# Patient Record
Sex: Male | Born: 1990
Health system: Southern US, Community
[De-identification: ages and names within clinical notes are randomized; demographics above are authoritative.]

## PROBLEM LIST (undated history)

## (undated) DIAGNOSIS — E66811 Obesity, class 1: Secondary | ICD-10-CM

## (undated) DIAGNOSIS — S82202A Unspecified fracture of shaft of left tibia, initial encounter for closed fracture: Secondary | ICD-10-CM

## (undated) DIAGNOSIS — F419 Anxiety disorder, unspecified: Secondary | ICD-10-CM

## (undated) DIAGNOSIS — R6882 Decreased libido: Secondary | ICD-10-CM

## (undated) DIAGNOSIS — E669 Obesity, unspecified: Secondary | ICD-10-CM

## (undated) DIAGNOSIS — K219 Gastro-esophageal reflux disease without esophagitis: Secondary | ICD-10-CM

## (undated) DIAGNOSIS — S43101A Unspecified dislocation of right acromioclavicular joint, initial encounter: Secondary | ICD-10-CM

## (undated) HISTORY — DX: Obesity, unspecified: E66.9

## (undated) HISTORY — DX: Obesity, class 1: E66.811

## (undated) HISTORY — DX: Decreased libido: R68.82

## (undated) HISTORY — DX: Gastro-esophageal reflux disease without esophagitis: K21.9

---
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
## 2014-06-16 DIAGNOSIS — M4316 Spondylolisthesis, lumbar region: Secondary | ICD-10-CM | POA: Insufficient documentation

## 2014-06-16 DIAGNOSIS — M545 Low back pain, unspecified: Secondary | ICD-10-CM | POA: Insufficient documentation

## 2015-06-17 ENCOUNTER — Emergency Department (HOSPITAL_COMMUNITY): Payer: BC Managed Care – PPO

## 2015-06-17 ENCOUNTER — Encounter (HOSPITAL_COMMUNITY): Payer: Self-pay | Admitting: Emergency Medicine

## 2015-06-17 ENCOUNTER — Inpatient Hospital Stay (HOSPITAL_COMMUNITY)
Admission: EM | Admit: 2015-06-17 | Discharge: 2015-06-20 | DRG: 494 | Disposition: A | Discharge status: Home Health | Payer: BC Managed Care – PPO | Attending: Orthopedic Surgery | Admitting: Orthopedic Surgery

## 2015-06-17 DIAGNOSIS — S82252A Displaced comminuted fracture of shaft of left tibia, initial encounter for closed fracture: Secondary | ICD-10-CM | POA: Diagnosis not present

## 2015-06-17 DIAGNOSIS — Z9889 Other specified postprocedural states: Secondary | ICD-10-CM

## 2015-06-17 DIAGNOSIS — Z8781 Personal history of (healed) traumatic fracture: Secondary | ICD-10-CM

## 2015-06-17 DIAGNOSIS — S82209A Unspecified fracture of shaft of unspecified tibia, initial encounter for closed fracture: Secondary | ICD-10-CM | POA: Diagnosis present

## 2015-06-17 DIAGNOSIS — S82202A Unspecified fracture of shaft of left tibia, initial encounter for closed fracture: Secondary | ICD-10-CM | POA: Diagnosis present

## 2015-06-17 DIAGNOSIS — Z419 Encounter for procedure for purposes other than remedying health state, unspecified: Secondary | ICD-10-CM

## 2015-06-17 HISTORY — DX: Unspecified fracture of shaft of left tibia, initial encounter for closed fracture: S82.202A

## 2015-06-17 LAB — BASIC METABOLIC PANEL
Anion gap: 7 (ref 5–15)
BUN: 19 mg/dL (ref 6–20)
CO2: 23 mmol/L (ref 22–32)
CREATININE: 0.9 mg/dL (ref 0.61–1.24)
Calcium: 9 mg/dL (ref 8.9–10.3)
Chloride: 108 mmol/L (ref 101–111)
GLUCOSE: 116 mg/dL — AB (ref 65–99)
Potassium: 3.9 mmol/L (ref 3.5–5.1)
SODIUM: 138 mmol/L (ref 135–145)

## 2015-06-17 LAB — CBC
HCT: 43.7 % (ref 39.0–52.0)
HEMOGLOBIN: 15.2 g/dL (ref 13.0–17.0)
MCH: 29.1 pg (ref 26.0–34.0)
MCHC: 34.8 g/dL (ref 30.0–36.0)
MCV: 83.7 fL (ref 78.0–100.0)
PLATELETS: 221 10*3/uL (ref 150–400)
RBC: 5.22 MIL/uL (ref 4.22–5.81)
RDW: 13.2 % (ref 11.5–15.5)
WBC: 13.8 10*3/uL — ABNORMAL HIGH (ref 4.0–10.5)

## 2015-06-17 MED ORDER — FENTANYL CITRATE (PF) 100 MCG/2ML IJ SOLN
100.0000 ug | Freq: Once | INTRAMUSCULAR | Status: AC
  Administered 2015-06-17: 100 ug via INTRAVENOUS
  Filled 2015-06-17: qty 2
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
## 2015-06-17 NOTE — ED Notes (Signed)
Ortho tech at bedside 
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
## 2015-06-18 ENCOUNTER — Inpatient Hospital Stay (HOSPITAL_COMMUNITY): Payer: BC Managed Care – PPO

## 2015-06-18 ENCOUNTER — Inpatient Hospital Stay (HOSPITAL_COMMUNITY): Payer: BC Managed Care – PPO | Admitting: Anesthesiology

## 2015-06-18 ENCOUNTER — Encounter (HOSPITAL_COMMUNITY)
Admission: EM | Disposition: A | Discharge status: Home Health | Payer: Self-pay | Source: Home / Self Care | Attending: Orthopedic Surgery

## 2015-06-18 DIAGNOSIS — S82202A Unspecified fracture of shaft of left tibia, initial encounter for closed fracture: Secondary | ICD-10-CM | POA: Diagnosis present

## 2015-06-18 DIAGNOSIS — S82209A Unspecified fracture of shaft of unspecified tibia, initial encounter for closed fracture: Secondary | ICD-10-CM | POA: Diagnosis present

## 2015-06-18 DIAGNOSIS — S82252A Displaced comminuted fracture of shaft of left tibia, initial encounter for closed fracture: Secondary | ICD-10-CM | POA: Diagnosis present

## 2015-06-18 HISTORY — PX: TIBIA IM NAIL INSERTION: SHX2516

## 2015-06-18 LAB — SURGICAL PCR SCREEN
MRSA, PCR: NEGATIVE
Staphylococcus aureus: NEGATIVE

## 2015-06-18 SURGERY — INSERTION, INTRAMEDULLARY ROD, TIBIA
Anesthesia: General | Laterality: Left

## 2015-06-18 MED ORDER — METOCLOPRAMIDE HCL 5 MG/ML IJ SOLN: 5.0000 mg | Freq: Three times a day (TID) | INTRAMUSCULAR | Status: DC | PRN

## 2015-06-18 MED ORDER — LACTATED RINGERS IV SOLN
INTRAVENOUS | Status: DC
  Administered 2015-06-18 (×2): via INTRAVENOUS

## 2015-06-18 MED ORDER — HYDROMORPHONE HCL 1 MG/ML IJ SOLN
1.0000 mg | INTRAMUSCULAR | Status: DC | PRN
  Administered 2015-06-18 – 2015-06-19 (×2): 1 mg via INTRAVENOUS
  Filled 2015-06-18 (×2): qty 1

## 2015-06-18 MED ORDER — METHOCARBAMOL 500 MG PO TABS: 500.0000 mg | ORAL_TABLET | Freq: Four times a day (QID) | ORAL | Status: DC | PRN

## 2015-06-18 MED ORDER — HYDROMORPHONE HCL 1 MG/ML IJ SOLN
INTRAMUSCULAR | Status: AC
  Filled 2015-06-18: qty 1

## 2015-06-18 MED ORDER — METHOCARBAMOL 500 MG PO TABS
500.0000 mg | ORAL_TABLET | Freq: Four times a day (QID) | ORAL | Status: DC | PRN
  Administered 2015-06-18 – 2015-06-20 (×6): 500 mg via ORAL
  Filled 2015-06-18 (×7): qty 1

## 2015-06-18 MED ORDER — CEFAZOLIN SODIUM-DEXTROSE 2-3 GM-% IV SOLR
2.0000 g | INTRAVENOUS | Status: AC
  Administered 2015-06-18: 2 g via INTRAVENOUS
  Filled 2015-06-18: qty 50

## 2015-06-18 MED ORDER — PROPOFOL 10 MG/ML IV BOLUS
INTRAVENOUS | Status: DC | PRN
  Administered 2015-06-18: 200 mg via INTRAVENOUS

## 2015-06-18 MED ORDER — MENTHOL 3 MG MT LOZG: 1.0000 | LOZENGE | OROMUCOSAL | Status: DC | PRN

## 2015-06-18 MED ORDER — LIDOCAINE HCL (CARDIAC) 20 MG/ML IV SOLN
INTRAVENOUS | Status: DC | PRN
  Administered 2015-06-18: 50 mg via INTRAVENOUS

## 2015-06-18 MED ORDER — ROCURONIUM BROMIDE 50 MG/5ML IV SOLN
INTRAVENOUS | Status: AC
  Filled 2015-06-18: qty 1

## 2015-06-18 MED ORDER — ONDANSETRON HCL 4 MG PO TABS: 4.0000 mg | ORAL_TABLET | Freq: Four times a day (QID) | ORAL | Status: DC | PRN

## 2015-06-18 MED ORDER — POTASSIUM CHLORIDE IN NACL 20-0.45 MEQ/L-% IV SOLN
INTRAVENOUS | Status: DC
  Administered 2015-06-18: 03:00:00 via INTRAVENOUS
  Filled 2015-06-18 (×4): qty 1000

## 2015-06-18 MED ORDER — DIPHENHYDRAMINE HCL 12.5 MG/5ML PO ELIX: 12.5000 mg | ORAL_SOLUTION | ORAL | Status: DC | PRN

## 2015-06-18 MED ORDER — ROCURONIUM BROMIDE 100 MG/10ML IV SOLN
INTRAVENOUS | Status: DC | PRN
  Administered 2015-06-18: 40 mg via INTRAVENOUS

## 2015-06-18 MED ORDER — POLYETHYLENE GLYCOL 3350 17 G PO PACK: 17.0000 g | PACK | Freq: Every day | ORAL | Status: DC | PRN

## 2015-06-18 MED ORDER — ONDANSETRON HCL 4 MG/2ML IJ SOLN
INTRAMUSCULAR | Status: DC | PRN
  Administered 2015-06-18: 4 mg via INTRAVENOUS

## 2015-06-18 MED ORDER — BISACODYL 10 MG RE SUPP
10.0000 mg | Freq: Every day | RECTAL | Status: DC | PRN
Start: 2015-06-18 — End: 2015-06-18

## 2015-06-18 MED ORDER — MAGNESIUM CITRATE PO SOLN: 1.0000 | Freq: Once | ORAL | Status: DC | PRN

## 2015-06-18 MED ORDER — ACETAMINOPHEN 650 MG RE SUPP: 650.0000 mg | Freq: Four times a day (QID) | RECTAL | Status: DC | PRN

## 2015-06-18 MED ORDER — GLYCOPYRROLATE 0.2 MG/ML IJ SOLN
INTRAMUSCULAR | Status: DC | PRN
  Administered 2015-06-18: .8 mg via INTRAVENOUS

## 2015-06-18 MED ORDER — ONDANSETRON HCL 4 MG/2ML IJ SOLN
INTRAMUSCULAR | Status: AC
  Filled 2015-06-18: qty 2

## 2015-06-18 MED ORDER — OXYCODONE HCL 5 MG PO TABS: 5.0000 mg | ORAL_TABLET | ORAL | Status: DC | PRN

## 2015-06-18 MED ORDER — OXYCODONE-ACETAMINOPHEN 5-325 MG PO TABS: 1.0000 | ORAL_TABLET | Freq: Four times a day (QID) | ORAL | Status: DC | PRN

## 2015-06-18 MED ORDER — ACETAMINOPHEN 325 MG PO TABS
650.0000 mg | ORAL_TABLET | Freq: Four times a day (QID) | ORAL | Status: DC | PRN
Start: 2015-06-18 — End: 2015-06-18

## 2015-06-18 MED ORDER — ENOXAPARIN SODIUM 40 MG/0.4ML ~~LOC~~ SOLN
40.0000 mg | SUBCUTANEOUS | Status: DC
Start: 2015-06-19 — End: 2015-06-20
  Administered 2015-06-19 – 2015-06-20 (×2): 40 mg via SUBCUTANEOUS
  Filled 2015-06-18 (×2): qty 0.4

## 2015-06-18 MED ORDER — MIDAZOLAM HCL 2 MG/2ML IJ SOLN
INTRAMUSCULAR | Status: AC
  Filled 2015-06-18: qty 2

## 2015-06-18 MED ORDER — METHOCARBAMOL 1000 MG/10ML IJ SOLN
500.0000 mg | Freq: Four times a day (QID) | INTRAVENOUS | Status: DC | PRN
  Filled 2015-06-18: qty 5

## 2015-06-18 MED ORDER — DEXTROSE 5 % IV SOLN
INTRAVENOUS | Status: DC | PRN
  Administered 2015-06-18: 16:00:00 via INTRAVENOUS

## 2015-06-18 MED ORDER — ONDANSETRON HCL 4 MG PO TABS: 4.0000 mg | ORAL_TABLET | Freq: Three times a day (TID) | ORAL | Status: DC | PRN

## 2015-06-18 MED ORDER — 0.9 % SODIUM CHLORIDE (POUR BTL) OPTIME
TOPICAL | Status: DC | PRN
  Administered 2015-06-18: 1000 mL

## 2015-06-18 MED ORDER — ONDANSETRON HCL 4 MG/2ML IJ SOLN
4.0000 mg | Freq: Four times a day (QID) | INTRAMUSCULAR | Status: DC | PRN
Start: 2015-06-18 — End: 2015-06-18

## 2015-06-18 MED ORDER — MAGNESIUM CITRATE PO SOLN: 1.0000 | Freq: Once | ORAL | Status: AC | PRN

## 2015-06-18 MED ORDER — OXYCODONE HCL 5 MG PO TABS
5.0000 mg | ORAL_TABLET | ORAL | Status: DC | PRN
  Administered 2015-06-18 (×2): 5 mg via ORAL
  Administered 2015-06-19 – 2015-06-20 (×8): 10 mg via ORAL
  Filled 2015-06-18: qty 1
  Filled 2015-06-18 (×8): qty 2

## 2015-06-18 MED ORDER — HYDROMORPHONE HCL 1 MG/ML IJ SOLN
1.0000 mg | INTRAMUSCULAR | Status: DC | PRN
  Administered 2015-06-18 (×3): 1 mg via INTRAVENOUS
  Filled 2015-06-18 (×3): qty 1

## 2015-06-18 MED ORDER — DOCUSATE SODIUM 100 MG PO CAPS
100.0000 mg | ORAL_CAPSULE | Freq: Two times a day (BID) | ORAL | Status: DC
  Administered 2015-06-18 – 2015-06-20 (×4): 100 mg via ORAL
  Filled 2015-06-18 (×4): qty 1

## 2015-06-18 MED ORDER — NEOSTIGMINE METHYLSULFATE 10 MG/10ML IV SOLN
INTRAVENOUS | Status: DC | PRN
  Administered 2015-06-18: 5 mg via INTRAVENOUS

## 2015-06-18 MED ORDER — BACLOFEN 10 MG PO TABS: 10.0000 mg | ORAL_TABLET | Freq: Three times a day (TID) | ORAL | Status: DC

## 2015-06-18 MED ORDER — METOCLOPRAMIDE HCL 5 MG PO TABS: 5.0000 mg | ORAL_TABLET | Freq: Three times a day (TID) | ORAL | Status: DC | PRN

## 2015-06-18 MED ORDER — SENNA 8.6 MG PO TABS: 1.0000 | ORAL_TABLET | Freq: Two times a day (BID) | ORAL | Status: DC

## 2015-06-18 MED ORDER — NEOSTIGMINE METHYLSULFATE 10 MG/10ML IV SOLN
INTRAVENOUS | Status: AC
  Filled 2015-06-18: qty 1

## 2015-06-18 MED ORDER — FENTANYL CITRATE (PF) 250 MCG/5ML IJ SOLN
INTRAMUSCULAR | Status: AC
  Filled 2015-06-18: qty 5

## 2015-06-18 MED ORDER — POTASSIUM CHLORIDE IN NACL 20-0.45 MEQ/L-% IV SOLN
INTRAVENOUS | Status: DC
  Administered 2015-06-18: 23:00:00 via INTRAVENOUS
  Filled 2015-06-18 (×4): qty 1000

## 2015-06-18 MED ORDER — BISACODYL 10 MG RE SUPP: 10.0000 mg | Freq: Every day | RECTAL | Status: DC | PRN

## 2015-06-18 MED ORDER — OXYCODONE HCL 5 MG PO TABS
ORAL_TABLET | ORAL | Status: AC
  Filled 2015-06-18: qty 1

## 2015-06-18 MED ORDER — CEFAZOLIN SODIUM 1-5 GM-% IV SOLN
1.0000 g | Freq: Four times a day (QID) | INTRAVENOUS | Status: AC
  Administered 2015-06-19 (×3): 1 g via INTRAVENOUS
  Filled 2015-06-18 (×4): qty 50

## 2015-06-18 MED ORDER — SENNA 8.6 MG PO TABS
1.0000 | ORAL_TABLET | Freq: Two times a day (BID) | ORAL | Status: DC
Start: 2015-06-18 — End: 2015-06-20
  Administered 2015-06-18 – 2015-06-20 (×4): 8.6 mg via ORAL
  Filled 2015-06-18 (×4): qty 1

## 2015-06-18 MED ORDER — BACITRACIN ZINC 500 UNIT/GM EX OINT
TOPICAL_OINTMENT | CUTANEOUS | Status: AC
  Filled 2015-06-18: qty 28.35

## 2015-06-18 MED ORDER — ACETAMINOPHEN 325 MG PO TABS: 650.0000 mg | ORAL_TABLET | Freq: Four times a day (QID) | ORAL | Status: DC | PRN

## 2015-06-18 MED ORDER — FENTANYL CITRATE (PF) 100 MCG/2ML IJ SOLN
INTRAMUSCULAR | Status: DC | PRN
  Administered 2015-06-18 (×4): 50 ug via INTRAVENOUS

## 2015-06-18 MED ORDER — ALUM & MAG HYDROXIDE-SIMETH 200-200-20 MG/5ML PO SUSP
30.0000 mL | ORAL | Status: DC | PRN
Start: 2015-06-18 — End: 2015-06-18

## 2015-06-18 MED ORDER — PHENOL 1.4 % MT LIQD
1.0000 | OROMUCOSAL | Status: DC | PRN
Start: 2015-06-18 — End: 2015-06-18

## 2015-06-18 MED ORDER — GLYCOPYRROLATE 0.2 MG/ML IJ SOLN
INTRAMUSCULAR | Status: AC
  Filled 2015-06-18: qty 3

## 2015-06-18 MED ORDER — HYDROMORPHONE HCL 1 MG/ML IJ SOLN
0.2500 mg | INTRAMUSCULAR | Status: DC | PRN
  Administered 2015-06-18 (×2): 0.5 mg via INTRAVENOUS

## 2015-06-18 MED ORDER — ONDANSETRON HCL 4 MG/2ML IJ SOLN: 4.0000 mg | Freq: Four times a day (QID) | INTRAMUSCULAR | Status: DC | PRN

## 2015-06-18 MED ORDER — LIDOCAINE HCL (CARDIAC) 20 MG/ML IV SOLN
INTRAVENOUS | Status: AC
  Filled 2015-06-18: qty 5

## 2015-06-18 MED ORDER — DOCUSATE SODIUM 100 MG PO CAPS: 100.0000 mg | ORAL_CAPSULE | Freq: Two times a day (BID) | ORAL | Status: DC

## 2015-06-18 MED ORDER — SENNA-DOCUSATE SODIUM 8.6-50 MG PO TABS: 2.0000 | ORAL_TABLET | Freq: Every day | ORAL | Status: DC

## 2015-06-18 MED ORDER — MIDAZOLAM HCL 5 MG/5ML IJ SOLN
INTRAMUSCULAR | Status: DC | PRN
  Administered 2015-06-18 (×2): 1 mg via INTRAVENOUS

## 2015-06-18 MED ORDER — ASPIRIN EC 325 MG PO TBEC: 325.0000 mg | DELAYED_RELEASE_TABLET | Freq: Every day | ORAL | Status: DC

## 2015-06-18 SURGICAL SUPPLY — 76 items
4.2 LONG DRILL ×3 IMPLANT
4.2 SHORT DRILL ×2 IMPLANT
BANDAGE ELASTIC 4 VELCRO ST LF (GAUZE/BANDAGES/DRESSINGS) ×3 IMPLANT
BANDAGE ELASTIC 6 VELCRO ST LF (GAUZE/BANDAGES/DRESSINGS) ×3 IMPLANT
BANDAGE ESMARK 6X9 LF (GAUZE/BANDAGES/DRESSINGS) IMPLANT
BIT DRILL 12 (BIT) ×1
BIT DRILL 12MM (BIT) ×1
BIT DRILL 190X12XCANN LRG QC (BIT) ×1 IMPLANT
BIT DRILL 4.2 (DRILL) ×1 IMPLANT
BIT DRILL FLUTED FEMUR 4.2/3 (BIT) ×3 IMPLANT
BIT DRL 190X12XCANN LRG QC (BIT) ×1
BLADE SURG 15 STRL LF DISP TIS (BLADE) ×1 IMPLANT
BLADE SURG 15 STRL SS (BLADE) ×2
BLADE SURG ROTATE 9660 (MISCELLANEOUS) IMPLANT
BNDG COHESIVE 6X5 TAN STRL LF (GAUZE/BANDAGES/DRESSINGS) ×3 IMPLANT
BNDG ESMARK 6X9 LF (GAUZE/BANDAGES/DRESSINGS)
BNDG GAUZE ELAST 4 BULKY (GAUZE/BANDAGES/DRESSINGS) ×3 IMPLANT
BOOTCOVER CLEANROOM LRG (PROTECTIVE WEAR) IMPLANT
COVER SURGICAL LIGHT HANDLE (MISCELLANEOUS) ×6 IMPLANT
DRAPE C-ARM 42X72 X-RAY (DRAPES) ×3 IMPLANT
DRAPE C-ARMOR (DRAPES) ×3 IMPLANT
DRAPE IMP U-DRAPE 54X76 (DRAPES) ×3 IMPLANT
DRAPE ORTHO SPLIT 77X108 STRL (DRAPES) ×6
DRAPE PROXIMA HALF (DRAPES) ×6 IMPLANT
DRAPE SURG ORHT 6 SPLT 77X108 (DRAPES) ×3 IMPLANT
DRAPE U-SHAPE 47X51 STRL (DRAPES) ×3 IMPLANT
DRILL 4.2 (DRILL) ×3
DRSG ADAPTIC 3X8 NADH LF (GAUZE/BANDAGES/DRESSINGS) ×3 IMPLANT
DRSG PAD ABDOMINAL 8X10 ST (GAUZE/BANDAGES/DRESSINGS) ×3 IMPLANT
DURAPREP 26ML APPLICATOR (WOUND CARE) ×3 IMPLANT
ELECT REM PT RETURN 9FT ADLT (ELECTROSURGICAL) ×3
ELECTRODE REM PT RTRN 9FT ADLT (ELECTROSURGICAL) ×1 IMPLANT
FACESHIELD WRAPAROUND (MASK) IMPLANT
GAUZE SPONGE 4X4 12PLY STRL (GAUZE/BANDAGES/DRESSINGS) ×6 IMPLANT
GLOVE BIO SURGEON STRL SZ7 (GLOVE) ×9 IMPLANT
GLOVE BIOGEL PI IND STRL 6.5 (GLOVE) ×2 IMPLANT
GLOVE BIOGEL PI INDICATOR 6.5 (GLOVE) ×4
GLOVE BIOGEL PI ORTHO PRO SZ8 (GLOVE) ×2
GLOVE ORTHO TXT STRL SZ7.5 (GLOVE) ×3 IMPLANT
GLOVE PI ORTHO PRO STRL SZ8 (GLOVE) ×1 IMPLANT
GLOVE SURG ORTHO 8.0 STRL STRW (GLOVE) ×6 IMPLANT
GOWN STRL REUS W/ TWL LRG LVL3 (GOWN DISPOSABLE) IMPLANT
GOWN STRL REUS W/TWL LRG LVL3 (GOWN DISPOSABLE)
GUIDEWIRE 3.2X400 (WIRE) ×3 IMPLANT
KIT BASIN OR (CUSTOM PROCEDURE TRAY) ×3 IMPLANT
KIT ROOM TURNOVER OR (KITS) ×3 IMPLANT
MANIFOLD NEPTUNE II (INSTRUMENTS) ×3 IMPLANT
NAIL TIBIAL 10X375MM (Nail) ×3 IMPLANT
NS IRRIG 1000ML POUR BTL (IV SOLUTION) ×3 IMPLANT
PACK GENERAL/GYN (CUSTOM PROCEDURE TRAY) ×3 IMPLANT
PACK UNIVERSAL I (CUSTOM PROCEDURE TRAY) ×3 IMPLANT
PAD ARMBOARD 7.5X6 YLW CONV (MISCELLANEOUS) ×6 IMPLANT
PAD CAST 4YDX4 CTTN HI CHSV (CAST SUPPLIES) ×1 IMPLANT
PADDING CAST ABS 4INX4YD NS (CAST SUPPLIES) ×2
PADDING CAST ABS COTTON 4X4 ST (CAST SUPPLIES) ×1 IMPLANT
PADDING CAST COTTON 4X4 STRL (CAST SUPPLIES) ×2
PADDING CAST COTTON 6X4 STRL (CAST SUPPLIES) ×3 IMPLANT
REAMER ROD DEEP FLUTE 2.5X950 (INSTRUMENTS) ×3 IMPLANT
SCREW LOCK STAR 5X40 (Screw) ×3 IMPLANT
SCREW LOCK STAR 5X64 (Screw) ×3 IMPLANT
SPLINT PLASTER CAST XFAST 5X30 (CAST SUPPLIES) ×1 IMPLANT
SPLINT PLASTER XFAST SET 5X30 (CAST SUPPLIES) ×2
SPONGE GAUZE 4X4 12PLY STER LF (GAUZE/BANDAGES/DRESSINGS) ×6 IMPLANT
STAPLER VISISTAT 35W (STAPLE) IMPLANT
STOCKINETTE IMPERVIOUS LG (DRAPES) ×3 IMPLANT
SUT MNCRL AB 4-0 PS2 18 (SUTURE) ×3 IMPLANT
SUT VIC AB 0 CT1 18XCR BRD 8 (SUTURE) IMPLANT
SUT VIC AB 0 CT1 8-18 (SUTURE)
SUT VIC AB 2-0 CT1 27 (SUTURE) ×2
SUT VIC AB 2-0 CT1 TAPERPNT 27 (SUTURE) ×1 IMPLANT
SUT VIC AB 3-0 SH 8-18 (SUTURE) ×3 IMPLANT
SUT VICRYL 0 CT 1 36IN (SUTURE) ×3 IMPLANT
TOWEL OR 17X24 6PK STRL BLUE (TOWEL DISPOSABLE) ×3 IMPLANT
TOWEL OR 17X26 10 PK STRL BLUE (TOWEL DISPOSABLE) ×3 IMPLANT
TRAY FOLEY CATH 16FRSI W/METER (SET/KITS/TRAYS/PACK) IMPLANT
WATER STERILE IRR 1000ML POUR (IV SOLUTION) ×3 IMPLANT

## 2015-06-18 NOTE — Anesthesia Postprocedure Evaluation (Signed)
  Anesthesia Post-op Note  Patient: Marcus Austin  Procedure(s) Performed: Procedure(s): INTRAMEDULLARY (IM) NAIL TIBIAL (Left)  Patient Location: PACU  Anesthesia Type:General  Level of Consciousness: awake and alert   Airway and Oxygen Therapy: Patient Spontanous Breathing  Post-op Pain: mild  Post-op Assessment: Post-op Vital signs reviewed, Patient's Cardiovascular Status Stable and Respiratory Function Stable  Post-op Vital Signs: Reviewed  Filed Vitals:   06/18/15 1850  BP:   Pulse: 96  Temp:   Resp: 18    Complications: No apparent anesthesia complications
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
## 2015-06-18 NOTE — Discharge Instructions (Signed)
Diet: As you were doing prior to hospitalization   Shower:  May shower but keep the wounds dry, use an occlusive plastic wrap, NO SOAKING IN TUB.  If the bandage gets wet, change with a clean dry gauze.  Dressing:  Keep splint dry.  Ok to change right calf dressings with neosporin and non-stick guaze.  Activity:  Increase activity slowly as tolerated, but follow the weight bearing instructions below.  No lifting or driving for 6 weeks.  Weight Bearing:   Touch toe weight bearing left leg.    To prevent constipation: you may use a stool softener such as -  Colace (over the counter) 100 mg by mouth twice a day  Drink plenty of fluids (prune juice may be helpful) and high fiber foods Miralax (over the counter) for constipation as needed.    Itching:  If you experience itching with your medications, try taking only a single pain pill, or even half a pain pill at a time.  You may take up to 10 pain pills per day, and you can also use benadryl over the counter for itching or also to help with sleep.   Precautions:  If you experience chest pain or shortness of breath - call 911 immediately for transfer to the hospital emergency department!!  If you develop a fever greater that 101 F, purulent drainage from wound, increased redness or drainage from wound, or calf pain -- Call the office at [phone]                                                Follow- Up Appointment:  Please call for an appointment to be seen in 2 weeks [place] - [phone]

## 2015-06-18 NOTE — Progress Notes (Signed)
Report called tp short stay/surg

## 2015-06-18 NOTE — Progress Notes (Signed)
The patient has been re-examined, and the chart reviewed, and there have been no interval changes to the documented history and physical.    The risks, benefits, and alternatives have been discussed at length, and the patient is willing to proceed.    Rudolpho Claxton P, MD  

## 2015-06-18 NOTE — Transfer of Care (Signed)
Immediate Anesthesia Transfer of Care Note  Patient: Marcus Austin  Procedure(s) Performed: Procedure(s): INTRAMEDULLARY (IM) NAIL TIBIAL (Left)  Patient Location: PACU  Anesthesia Type:General  Level of Consciousness: awake, alert  and oriented  Airway & Oxygen Therapy: Patient Spontanous Breathing and Patient connected to face mask oxygen  Post-op Assessment: Report given to RN and Post -op Vital signs reviewed and stable  Post vital signs: Reviewed and stable  Last Vitals:  Filed Vitals:   06/18/15 1359  BP: 125/51  Pulse: 46  Temp: 37.2 C  Resp:     Complications: No apparent anesthesia complications

## 2015-06-18 NOTE — Progress Notes (Signed)
Orthopedic Tech Progress Note Patient Details:  Marcus Austin [birth year] [id number]  Patient ID: Marcus Austin, male   DOB: [birth year], 24 y.o.   MRN: [id number]   Marcus Austin 06/18/2015, 8:49 AMRefused trapeze bar

## 2015-06-18 NOTE — Anesthesia Procedure Notes (Signed)
Procedure Name: Intubation Date/Time: 06/18/2015 3:23 PM Performed by: Darcey Nora B Pre-anesthesia Checklist: Patient identified, Emergency Drugs available, Suction available and Patient being monitored Patient Re-evaluated:Patient Re-evaluated prior to inductionOxygen Delivery Method: Circle system utilized Preoxygenation: Pre-oxygenation with 100% oxygen Intubation Type: IV induction Ventilation: Mask ventilation without difficulty Laryngoscope Size: Mac and 4 Grade View: Grade II Tube type: Oral Tube size: 7.5 mm Number of attempts: 1 Airway Equipment and Method: Stylet Placement Confirmation: ETT inserted through vocal cords under direct vision,  breath sounds checked- equal and bilateral and positive ETCO2 Secured at: 23 ( cm at teeth) cm Tube secured with: Tape Dental Injury: Teeth and Oropharynx as per pre-operative assessment

## 2015-06-18 NOTE — Anesthesia Preprocedure Evaluation (Addendum)
Anesthesia Evaluation  Patient identified by MRN, date of birth, ID band Patient awake    Reviewed: Allergy & Precautions, NPO status , Patient's Chart, lab work & pertinent test results, reviewed documented beta blocker date and time   Airway Mallampati: I  TM Distance: >3 FB Neck ROM: Full    Dental no notable dental hx. (+) Teeth Intact, Dental Advisory Given   Pulmonary  breath sounds clear to auscultation  Pulmonary exam normal       Cardiovascular Exercise Tolerance: Good Normal cardiovascular examRhythm:Regular Rate:Normal     Neuro/Psych    GI/Hepatic   Endo/Other    Renal/GU      Musculoskeletal   Abdominal   Peds  Hematology   Anesthesia Other Findings   Reproductive/Obstetrics                            Anesthesia Physical Anesthesia Plan  ASA: I  Anesthesia Plan: General   Post-op Pain Management:    Induction: Intravenous  Airway Management Planned: Oral ETT  Additional Equipment:   Intra-op Plan:   Post-operative Plan: Extubation in OR  Informed Consent:   Dental advisory given  Plan Discussed with:   Anesthesia Plan Comments:         Anesthesia Quick Evaluation

## 2015-06-19 LAB — CREATININE, SERUM
CREATININE: 0.96 mg/dL (ref 0.61–1.24)
GFR calc non Af Amer: >60 mL/min (ref >60)

## 2015-06-19 LAB — CBC
HCT: 39.4 % (ref 39.0–52.0)
Hemoglobin: 13.4 g/dL (ref 13.0–17.0)
MCH: 28.5 pg (ref 26.0–34.0)
MCHC: 34 g/dL (ref 30.0–36.0)
MCV: 83.7 fL (ref 78.0–100.0)
Platelets: 171 10*3/uL (ref 150–400)
RBC: 4.71 MIL/uL (ref 4.22–5.81)
RDW: 13.3 % (ref 11.5–15.5)
WBC: 8.8 10*3/uL (ref 4.0–10.5)

## 2015-06-19 NOTE — Progress Notes (Signed)
Orthopedic Tech Progress Note Patient Details:  Marcus Austin 1991/11/22 [id number]  Patient ID: Marcus Austin, male   DOB: 05-Feb-1991, 24 y.o.   MRN: [id number] Viewed order from doctor's order list  Marcus Austin 06/19/2015, 3:40 PM

## 2015-06-19 NOTE — Progress Notes (Signed)
Orthopedic Tech Progress Note Patient Details:  Marcus Austin 1991-07-17 [id number]  Ortho Devices Type of Ortho Device: Crutches Splint Material: Plaster Ortho Device/Splint Location: Lt leg Ortho Device/Splint Interventions: Application   [name] 06/19/2015, 3:39 PM
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
## 2015-06-19 NOTE — Progress Notes (Signed)
RN was going to start pt out on a full liquid diet; however, pt has been eating full liquids throughout the night. After discussion with pt RN will place an order for a regular diet. Pt feels that he could eat a regular meal and would like to try this morning. Did educate pt that if he starts feeling nauseous to stop eating and notify his nurse. Pt and RN agree with plan of care at this time. Will continue to educate and monitor.

## 2015-06-19 NOTE — Progress Notes (Signed)
Subjective: 1 Day Post-Op Procedure(s) (LRB): INTRAMEDULLARY (IM) NAIL TIBIAL (Left) Patient reports pain as moderate.  Spoke with PT slow progression requiring assist with OOB recommending 1 more day inpatient for additional PT.  Objective: Vital signs in last 24 hours: Temp:  [97.9 F (36.6 C)-99.7 F (37.6 C)] 99.7 F (37.6 C) (06/25 0548) Pulse Rate:  [46-106] 96 (06/25 0548) Resp:  [12-31] 20 (06/25 0548) BP: (125-155)/(38-79) 133/38 mmHg (06/25 0548) SpO2:  [84 %-100 %] 94 % (06/25 0548)  Intake/Output from previous day: 06/24 0701 - 06/25 0700 In: 2100 [I.V.:2050; IV Piggyback:50] Out: 350 [Urine:200; Blood:150] Intake/Output this shift: Total I/O In: 240 [P.O.:240] Out: -    Recent Labs  06/17/15 2304 06/18/15 2345  HGB 15.2 13.4    Recent Labs  06/17/15 2304 06/18/15 2345  WBC 13.8* 8.8  RBC 5.22 4.71  HCT 43.7 39.4  PLT 221 171    Recent Labs  06/17/15 2304 06/18/15 2345  NA 138  --   K 3.9  --   CL 108  --   CO2 23  --   BUN 19  --   CREATININE 0.90 0.96  GLUCOSE 116*  --   CALCIUM 9.0  --    No results for input(s): LABPT, INR in the last 72 hours.  Neurovascular intact Sensation intact distally Incision: dressing C/D/I Compartment soft  Assessment/Plan: 1 Day Post-Op Procedure(s) (LRB): INTRAMEDULLARY (IM) NAIL TIBIAL (Left) Up with therapy  NWB/TDWB for balance LLE, crutches Pain control as ordered Plan for d/c home tomorrow.   Margart Sickles 06/19/2015, 12:25 PM

## 2015-06-19 NOTE — Care Management Note (Signed)
Case Management Note  Patient Details  Name: Marcus Austin MRN: [id number] Date of Birth: 30-May-1991  Subjective/Objective:                  Fractured Left Tibia and fibula, transverse, closed, distal midshaft PROCEDURE: INTRAMEDULLARY (IM) NAIL TIBIAL  Action/Plan: POD #1. CM spoke to patient and Fiance at the bedside. PT at the bedside currently to evaluate patient. OT consult pending. Pt states that his plan is to go home with his fiance and per PT, pt have 3 steps to navigate into the home. The patient denies any specific needs at this time and CM will continue to monitor for any home needs.   Expected Discharge Date:                  Expected Discharge Plan:  Home/Self Care  In-House Referral:     Discharge planning Services  CM Consult  Post Acute Care Choice:    Choice offered to:     DME Arranged:    DME Agency:     HH Arranged:    HH Agency:     Status of Service:  In process, will continue to follow  Medicare Important Message Given:    Date Medicare IM Given:    Medicare IM give by:    Date Additional Medicare IM Given:    Additional Medicare Important Message give by:     If discussed at Long Length of Stay Meetings, dates discussed:    Additional Comments:  Darcel Smalling, RN 06/19/2015, 11:44 AM

## 2015-06-19 NOTE — Care Management Note (Signed)
Case Management Note  Patient Details  Name: Marcus Austin MRN: [id number] Date of Birth: September 30, 1991  Subjective/Objective:                  Fractured Left Tibia and fibula, transverse, closed, distal midshaft PROCEDURE: INTRAMEDULLARY (IM) NAIL TIBIAL  Action/Plan: CM called Ortho Tech and advised of need for crutches. Message accepted and states they will deliver. PT recommending HH PT; OT consult pending. CM spoke about possible HH PT and offered choice; patient and fiance state that they would prefer him go to outpatient therapy and would like to discuss further 06/20/15. CM will continue to monitor.   Expected Discharge Date:  06/20/15               Expected Discharge Plan:  Home/Self Care  In-House Referral:     Discharge planning Services  CM Consult  Post Acute Care Choice:    Choice offered to:     DME Arranged:  Crutches DME Agency:  Other - Comment (Or[facility] Tech with [facility] Provided)  HH Arranged:    HH Agency:     Status of Service:  In process, will continue to follow  Medicare Important Message Given:    Date Medicare IM Given:    Medicare IM give by:    Date Additional Medicare IM Given:    Additional Medicare Important Message give by:     If discussed at Long Length of Stay Meetings, dates discussed:    Additional Comments:  Darcel Smalling, RN 06/19/2015, 3:33 PM

## 2015-06-20 NOTE — Progress Notes (Signed)
Subjective: 2 Days Post-Op Procedure(s) (LRB): INTRAMEDULLARY (IM) NAIL TIBIAL (Left) Patient reports pain as mild.  Pt doing much better this AM sitting up in chair, pain better controled.  Objective: Vital signs in last 24 hours: Temp:  [98.3 F (36.8 C)-98.8 F (37.1 C)] 98.8 F (37.1 C) (06/26 0526) Pulse Rate:  [69-94] 69 (06/26 0526) Resp:  [16-18] 16 (06/26 0526) BP: (131-142)/(64-71) 131/71 mmHg (06/26 0526) SpO2:  [97 %-98 %] 98 % (06/26 0526)  Intake/Output from previous day: 06/25 0701 - 06/26 0700 In: 240 [P.O.:240] Out: -  Intake/Output this shift: Total I/O In: 240 [P.O.:240] Out: -    Recent Labs  06/17/15 2304 06/18/15 2345  HGB 15.2 13.4    Recent Labs  06/17/15 2304 06/18/15 2345  WBC 13.8* 8.8  RBC 5.22 4.71  HCT 43.7 39.4  PLT 221 171    Recent Labs  06/17/15 2304 06/18/15 2345  NA 138  --   K 3.9  --   CL 108  --   CO2 23  --   BUN 19  --   CREATININE 0.90 0.96  GLUCOSE 116*  --   CALCIUM 9.0  --    No results for input(s): LABPT, INR in the last 72 hours.  Neurovascular intact Incision: dressing C/D/I Compartment soft  Assessment/Plan: 2 Days Post-Op Procedure(s) (LRB): INTRAMEDULLARY (IM) NAIL TIBIAL (Left) Up with PT TDWB LLE Home today   Marcus Austin 06/20/2015, 9:19 AM
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
## 2015-06-20 NOTE — Progress Notes (Signed)
OT Cancellation Note and Discharge  Patient Details Name: Amrom Maskell MRN: [id number] DOB: 1991/07/24   Cancelled Treatment:    Reason Eval/Treat Not Completed: OT screened, no needs identified, will sign off. Pt's wife is a NT at [facility] and is very aware of how she needs to help pt. They do not have any concerns about BADLs, we will sign off.  Evette Georges [phone] 06/20/2015, 9:15 AM

## 2015-06-20 NOTE — Care Management Note (Addendum)
Case Management Note  Patient Details  Name: Marcus Austin MRN: [id number] Date of Birth: 12-25-1991  Subjective/Objective:                   Fractured Left Tibia and fibula, transverse, closed, distal midshaft PROCEDURE: INTRAMEDULLARY (IM) NAIL TIBIAL  Action/Plan:  D/c to home today with family.   Expected Discharge Date:  06/20/15              Expected Discharge Plan:  Home/Self Care  In-House Referral:     Discharge planning Services  CM Consult  Post Acute Care Choice:    Choice offered to:     DME Arranged:  Crutches DME Agency:  Other - Comment (Or[facility] Tech with [facility] Provided)  HH Arranged:    HH Agency:     Status of Service: Completed.  Medicare Important Message Given:    Date Medicare IM Given:    Medicare IM give by:    Date Additional Medicare IM Given:    Additional Medicare Important Message give by:     If discussed at Long Length of Stay Meetings, dates discussed:    Additional Comments:  Spoke with patient's nurse Marcus Austin) who confirmed with patient that he wishes to pursue outpt. PT if needed at Dr. Shelba Flake office.  Currently no orders for outpt. PT, per PT & MD notes patient is currently TDWB LLE.     Patient or patient's family member  will call Dr. Shelba Flake office on Monday 06/21/15 to set up follow up appointment and outpt. PT.    Verified d/c today and no other CM needs at this time.      AVS updated.    Marcus Fahrner H. Excell Seltzer, RN, BSN, CCM   06/20/2015, 11:05 AM
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
## 2015-06-22 ENCOUNTER — Encounter (HOSPITAL_COMMUNITY): Payer: Self-pay | Admitting: Orthopedic Surgery

## 2015-12-30 ENCOUNTER — Emergency Department (HOSPITAL_BASED_OUTPATIENT_CLINIC_OR_DEPARTMENT_OTHER)
Admission: EM | Admit: 2015-12-30 | Discharge: 2015-12-30 | Disposition: A | Discharge status: Home / Self Care | Payer: Worker's Compensation | Attending: Emergency Medicine | Admitting: Emergency Medicine

## 2015-12-30 ENCOUNTER — Encounter (HOSPITAL_BASED_OUTPATIENT_CLINIC_OR_DEPARTMENT_OTHER): Payer: Self-pay | Admitting: *Deleted

## 2015-12-30 DIAGNOSIS — Y92410 Unspecified street and highway as the place of occurrence of the external cause: Secondary | ICD-10-CM | POA: Insufficient documentation

## 2015-12-30 DIAGNOSIS — Z7982 Long term (current) use of aspirin: Secondary | ICD-10-CM | POA: Insufficient documentation

## 2015-12-30 DIAGNOSIS — Z8781 Personal history of (healed) traumatic fracture: Secondary | ICD-10-CM | POA: Insufficient documentation

## 2015-12-30 DIAGNOSIS — Y998 Other external cause status: Secondary | ICD-10-CM | POA: Insufficient documentation

## 2015-12-30 DIAGNOSIS — Z041 Encounter for examination and observation following transport accident: Secondary | ICD-10-CM | POA: Diagnosis not present

## 2015-12-30 DIAGNOSIS — Z79899 Other long term (current) drug therapy: Secondary | ICD-10-CM | POA: Diagnosis not present

## 2015-12-30 DIAGNOSIS — S4991XA Unspecified injury of right shoulder and upper arm, initial encounter: Secondary | ICD-10-CM | POA: Diagnosis present

## 2015-12-30 DIAGNOSIS — Y9389 Activity, other specified: Secondary | ICD-10-CM | POA: Insufficient documentation

## 2015-12-30 NOTE — Discharge Instructions (Signed)
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
## 2017-04-01 ENCOUNTER — Encounter (HOSPITAL_BASED_OUTPATIENT_CLINIC_OR_DEPARTMENT_OTHER): Payer: Self-pay | Admitting: Emergency Medicine

## 2017-04-01 ENCOUNTER — Emergency Department (HOSPITAL_BASED_OUTPATIENT_CLINIC_OR_DEPARTMENT_OTHER)
Admission: EM | Admit: 2017-04-01 | Discharge: 2017-04-01 | Disposition: A | Discharge status: Home / Self Care | Payer: BLUE CROSS/BLUE SHIELD | Attending: Emergency Medicine | Admitting: Emergency Medicine

## 2017-04-01 DIAGNOSIS — F17290 Nicotine dependence, other tobacco product, uncomplicated: Secondary | ICD-10-CM | POA: Diagnosis not present

## 2017-04-01 DIAGNOSIS — Z7982 Long term (current) use of aspirin: Secondary | ICD-10-CM | POA: Insufficient documentation

## 2017-04-01 DIAGNOSIS — Y929 Unspecified place or not applicable: Secondary | ICD-10-CM | POA: Insufficient documentation

## 2017-04-01 DIAGNOSIS — Y999 Unspecified external cause status: Secondary | ICD-10-CM | POA: Insufficient documentation

## 2017-04-01 DIAGNOSIS — S8992XA Unspecified injury of left lower leg, initial encounter: Secondary | ICD-10-CM | POA: Diagnosis present

## 2017-04-01 DIAGNOSIS — S81812A Laceration without foreign body, left lower leg, initial encounter: Secondary | ICD-10-CM | POA: Insufficient documentation

## 2017-04-01 DIAGNOSIS — W268XXA Contact with other sharp object(s), not elsewhere classified, initial encounter: Secondary | ICD-10-CM | POA: Insufficient documentation

## 2017-04-01 DIAGNOSIS — Y939 Activity, unspecified: Secondary | ICD-10-CM | POA: Insufficient documentation

## 2017-04-01 MED ORDER — LIDOCAINE-EPINEPHRINE 2 %-1:100000 IJ SOLN
INTRAMUSCULAR | Status: AC
  Administered 2017-04-01: 1 mL
  Filled 2017-04-01: qty 1

## 2017-04-01 MED ORDER — LIDOCAINE-EPINEPHRINE (PF) 2 %-1:200000 IJ SOLN: 10.0000 mL | Freq: Once | INTRAMUSCULAR | Status: DC

## 2017-04-01 MED ORDER — BACITRACIN ZINC 500 UNIT/GM EX OINT: TOPICAL_OINTMENT | Freq: Two times a day (BID) | CUTANEOUS | Status: DC

## 2017-04-01 NOTE — ED Provider Notes (Signed)
[facility] Provider Note   CSN: [id number] Arrival date & time: 04/01/17  1717 By signing my name below, I, Marcus Austin, attest that this documentation has been prepared under the direction and in the presence of Issaih Kaus, [place]. Electronically Signed: Bridgette Austin, ED Scribe. 04/01/17. 6:45 PM.  History   Chief Complaint Chief Complaint  Patient presents with  . Laceration    HPI The history is provided by the patient. No language interpreter was used.   HPI Comments: Marcus Austin is a 26 y.o. male with no pertinent PMHx, who presents to the Emergency Department complaining of laceration right below left knee following a mechanical injury ~5:30 pm. Pt reports he was using his brand new chain saw when it accidentally struck his knee. Bleeding controlled. He has not tried any OTC medications PTA. Has h/o closed left tibial fracture to the area two years ago. Pt is not on blood thinners. Denies any additional injuries. Pt denies any pain at this time. Pt tetanus UTD.  Past Medical History:  Diagnosis Date  . Closed left tibial fracture 06/17/2015    Patient Active Problem List   Diagnosis Date Noted  . Tibia fracture 06/18/2015  . Closed left tibial fracture 06/17/2015    Past Surgical History:  Procedure Laterality Date  . TIBIA IM NAIL INSERTION Left 06/18/2015   Procedure: INTRAMEDULLARY (IM) NAIL TIBIAL;  Surgeon: Teryl Lucy, MD;  Location: [facility];  Service: Orthopedics;  Laterality: Left;       Home Medications    Prior to Admission medications   Medication Sig Start Date End Date Taking? Authorizing Provider  aspirin EC 325 MG tablet Take 1 tablet (325 mg total) by mouth daily. 06/18/15   Teryl Lucy, MD  baclofen (LIORESAL) 10 MG tablet Take 1 tablet (10 mg total) by mouth 3 (three) times daily. As needed for muscle spasm 06/18/15   Teryl Lucy, MD  ondansetron (ZOFRAN) 4 MG tablet Take 1 tablet (4 mg total) by mouth every 8 (eight) hours as needed  for nausea or vomiting. 06/18/15   Teryl Lucy, MD  oxyCODONE-acetaminophen (ROXICET) 5-325 MG per tablet Take 1-2 tablets by mouth every 6 (six) hours as needed for severe pain. 06/18/15   Teryl Lucy, MD  sennosides-docusate sodium (SENOKOT-S) 8.6-50 MG tablet Take 2 tablets by mouth daily. 06/18/15   Teryl Lucy, MD    Family History No family history on file.  Social History Social History  Substance Use Topics  . Smoking status: Never Smoker  . Smokeless tobacco: Current User    Types: Snuff  . Alcohol use Yes     Comment: Occasionally     Allergies   Sulfa antibiotics   Review of Systems Review of Systems  Constitutional: Negative for chills and fever.  Musculoskeletal: Negative for arthralgias, joint swelling and myalgias.  Skin: Positive for wound.  All other systems reviewed and are negative.  Physical Exam Updated Vital Signs BP (!) 150/86 (BP Location: Right Arm)   Pulse 76   Temp 98.5 F (36.9 C) (Oral)   Resp 18   Ht  (1.88 m)   Wt 94.3 kg   SpO2 100%   BMI 26.71 kg/m   Physical Exam  Constitutional: He appears well-developed and well-nourished.  Well appearing  HENT:  Head: Normocephalic and atraumatic.  Nose: Nose normal.  Eyes: Conjunctivae and EOM are normal.  Neck: Normal range of motion.  Cardiovascular: Normal rate and intact distal pulses.   2+ Distal pulses intact  distal to laceration and BLE.  Pulmonary/Chest: Effort normal. No respiratory distress.  Normal work of breathing. No respiratory distress noted.   Abdominal: Soft.  Musculoskeletal: Normal range of motion.  6 cm laceration and 4 cm laceration inferior and medial to left patella. No active bleeding. No tenderness to palpation. Good range of motion of left knee. Laceration does not appear to have penetrated the joint capsule.  Neurological: He is alert.  Sensation and muscle strength intact distal to laceration and BLE. Left knee with good strength against resistance  to left knee flexion and extension.  Skin: Skin is warm.  Psychiatric: He has a normal mood and affect. His behavior is normal.  Nursing note and vitals reviewed.    ED Treatments / Results  DIAGNOSTIC STUDIES: Oxygen Saturation is 100% on RA, normal by my interpretation.   COORDINATION OF CARE: 6:39 PM-Discussed next steps with pt. Pt verbalized understanding and is agreeable with the plan.   Labs (all labs ordered are listed, but only abnormal results are displayed) Labs Reviewed - No data to display  EKG  EKG Interpretation None       Radiology No results found.  Procedures .Marland KitchenLaceration Repair Date/Time: 04/01/2017 6:43 PM Performed by: Alvina Chou Authorized by: Alvina Chou   Consent:    Consent obtained:  Verbal   Consent given by:  Patient Anesthesia (see MAR for exact dosages):    Anesthesia method:  Local infiltration   Local anesthetic:  Lidocaine 2% WITH epi Laceration details:    Location:  Leg   Leg location:  L lower leg   Length (cm):  6 Repair type:    Repair type:  Simple Pre-procedure details:    Preparation:  Patient was prepped and draped in usual sterile fashion Exploration:    Wound exploration: wound explored through full range of motion and entire depth of wound probed and visualized     Contaminated: no   Post-procedure details:    Patient tolerance of procedure:  Tolerated well, no immediate complications .Marland KitchenLaceration Repair Date/Time: 04/01/2017 6:55 PM Performed by: Alvina Chou Authorized by: Alvina Chou   Consent:    Consent obtained:  Verbal   Consent given by:  Patient   Risks discussed:  Infection, pain, need for additional repair, poor cosmetic result, poor wound healing, nerve damage, vascular damage, tendon damage and retained foreign body   Alternatives discussed:  No treatment, delayed treatment and observation Anesthesia (see MAR for exact dosages):    Anesthesia  method:  Local infiltration   Local anesthetic:  Lidocaine 2% WITH epi Laceration details:    Location:  Leg   Leg location:  L lower leg   Length (cm):  11 (2 lacerations parellel to each other. One 6cm long and other 5 cm long.)   Depth (mm):  4 Repair type:    Repair type:  Simple Pre-procedure details:    Preparation:  Patient was prepped and draped in usual sterile fashion Exploration:    Hemostasis achieved with:  Direct pressure   Wound exploration: wound explored through full range of motion and entire depth of wound probed and visualized     Wound extent: areolar tissue violated     Wound extent: no fascia violation noted, no foreign bodies/material noted, no muscle damage noted, no nerve damage noted, no tendon damage noted, no underlying fracture noted and no vascular damage noted     Contaminated: no   Treatment:    Area cleansed with:  Saline and Betadine   Amount of cleaning:  Extensive   Irrigation solution:  Sterile saline   Irrigation volume:  100 cc   Irrigation method:  Pressure wash and syringe   Visualized foreign bodies/material removed: yes   Skin repair:    Repair method:  Sutures   Suture size:  3-0   Suture material:  Prolene   Suture technique:  Simple interrupted   Number of sutures:  13 Approximation:    Approximation:  Close   Vermilion border: well-aligned   Post-procedure details:    Dressing:  Antibiotic ointment, sterile dressing and splint for protection   Patient tolerance of procedure:  Tolerated well, no immediate complications   Medications Ordered in ED Medications  lidocaine-EPINEPHrine (XYLOCAINE W/EPI) 2 %-1:200000 (PF) injection 10 mL (not administered)  bacitracin ointment (not administered)  lidocaine-EPINEPHrine (XYLOCAINE W/EPI) 2 %-1:100000 (with pres) injection (1 mL  Given by Other 04/01/17 1925)     Initial Impression / Assessment and Plan / ED Course  I have reviewed the triage vital signs and the nursing  notes.  Pertinent labs & imaging results that were available during my care of the patient were reviewed by me and considered in my medical decision making (see chart for details).     Tetanus UTD. Laceration occurred < 12 hours prior to repair. Discussed laceration care with pt and answered questions. Pt to f-u for suture removal in 10-14 days and wound check sooner should there be signs of dehiscence or infection. Pt is hemodynamically stable with no complaints prior to dc.     Final Clinical Impressions(s) / ED Diagnoses   Final diagnoses:  Laceration of left lower extremity, initial encounter    New Prescriptions New Prescriptions   No medications on file   I personally performed the services described in this documentation, which was scribed in my presence. The recorded information has been reviewed and is accurate.    [address] [place], [place] 04/01/17 [address] [place], [place] 04/01/17 2024    Rolan Bucco, MD 04/01/17 2227

## 2017-04-01 NOTE — ED Notes (Signed)
EDPA at bedside suturing wound.

## 2017-04-01 NOTE — Discharge Instructions (Signed)
Please use Ace wrap every day and make sure not to bend knee. Please keep area clean and dry every day. Make sure to pat dry. Do not drench in water as this may weaken the sutures. Please use Neosporin or bacitracin over area sutured. Please return here or go to urgent care, or your primary care provider to have sutures removed and to have wound rechecked in 10-14 days.  Get help right away if: You develop severe swelling around the injury site. Your pain suddenly increases and is severe. You develop painful lumps near the wound or on skin that is anywhere on your body. You have a red streak going away from your wound. The wound is on your hand or foot and you cannot properly move a finger or toe. The wound is on your hand or foot and you notice that your fingers or toes look pale or bluish.

## 2017-04-01 NOTE — ED Triage Notes (Signed)
Pt pt presents to ED with c/o left leg laceration with chain saw

## 2019-04-14 ENCOUNTER — Other Ambulatory Visit: Payer: Self-pay

## 2019-04-14 ENCOUNTER — Ambulatory Visit: Payer: Self-pay | Admitting: Family Medicine

## 2019-06-25 ENCOUNTER — Ambulatory Visit: Payer: Self-pay | Admitting: Family Medicine

## 2019-07-02 ENCOUNTER — Ambulatory Visit: Payer: Self-pay | Admitting: Family Medicine

## 2019-07-02 DIAGNOSIS — Z0289 Encounter for other administrative examinations: Secondary | ICD-10-CM

## 2019-07-02 NOTE — Progress Notes (Deleted)
Office Note 07/02/2019  CC: No chief complaint on file.   HPI:  Marcus Austin is a 28 y.o. {desc; ethnicity:[id number]} male who is *** Patient's most recent primary MD: *** Old records *** reviewed prior to or during today's visit.  Past Medical History:  Diagnosis Date  . Closed left tibial fracture 06/17/2015    Past Surgical History:  Procedure Laterality Date  . TIBIA IM NAIL INSERTION Left 06/18/2015   Procedure: INTRAMEDULLARY (IM) NAIL TIBIAL;  Surgeon: Marchia Bond, MD;  Location: [facility];  Service: Orthopedics;  Laterality: Left;    No family history on file.  Social History   Socioeconomic History  . Marital status: Single    Spouse name: Not on file  . Number of children: Not on file  . Years of education: Not on file  . Highest education level: Not on file  Occupational History  . Not on file  Social Needs  . Financial resource strain: Not on file  . Food insecurity    Worry: Not on file    Inability: Not on file  . Transportation needs    Medical: Not on file    Non-medical: Not on file  Tobacco Use  . Smoking status: Never Smoker  . Smokeless tobacco: Current User    Types: Snuff  Substance and Sexual Activity  . Alcohol use: Yes    Comment: Occasionally  . Drug use: No  . Sexual activity: Yes  Lifestyle  . Physical activity    Days per week: Not on file    Minutes per session: Not on file  . Stress: Not on file  Relationships  . Social Herbalist on phone: Not on file    Gets together: Not on file    Attends religious service: Not on file    Active member of club or organization: Not on file    Attends meetings of clubs or organizations: Not on file    Relationship status: Not on file  . Intimate partner violence    Fear of current or ex partner: Not on file    Emotionally abused: Not on file    Physically abused: Not on file    Forced sexual activity: Not on file  Other Topics Concern  . Not on file  Social History Narrative   . Not on file    Outpatient Encounter Medications as of 07/02/2019  Medication Sig  . aspirin EC 325 MG tablet Take 1 tablet (325 mg total) by mouth daily.  . baclofen (LIORESAL) 10 MG tablet Take 1 tablet (10 mg total) by mouth 3 (three) times daily. As needed for muscle spasm  . ondansetron (ZOFRAN) 4 MG tablet Take 1 tablet (4 mg total) by mouth every 8 (eight) hours as needed for nausea or vomiting.  Marland Kitchen oxyCODONE-acetaminophen (ROXICET) 5-325 MG per tablet Take 1-2 tablets by mouth every 6 (six) hours as needed for severe pain.  Marland Kitchen sennosides-docusate sodium (SENOKOT-S) 8.6-50 MG tablet Take 2 tablets by mouth daily.   No facility-administered encounter medications on file as of 07/02/2019.     Allergies  Allergen Reactions  . Sulfa Antibiotics Swelling    ROS *** PE; There were no vitals taken for this visit. *** Pertinent labs:  ***  ASSESSMENT AND PLAN:   No problem-specific Assessment & Plan notes found for this encounter.   An After Visit Summary was printed and given to the patient.  No follow-ups on file.   Signed:  Crissie Sickles, MD  07/02/2019  

## 2019-08-01 ENCOUNTER — Other Ambulatory Visit: Payer: Self-pay

## 2019-08-01 ENCOUNTER — Ambulatory Visit (INDEPENDENT_AMBULATORY_CARE_PROVIDER_SITE_OTHER): Payer: 59 | Admitting: Family Medicine

## 2019-08-01 ENCOUNTER — Encounter: Payer: Self-pay | Admitting: Family Medicine

## 2019-08-01 VITALS — BP 130/82 | HR 72 | Temp 98.5°F | Resp 16 | Ht 74.5 in | Wt 246.0 lb

## 2019-08-01 DIAGNOSIS — R6882 Decreased libido: Secondary | ICD-10-CM | POA: Diagnosis not present

## 2019-08-01 DIAGNOSIS — Z0000 Encounter for general adult medical examination without abnormal findings: Secondary | ICD-10-CM | POA: Diagnosis not present

## 2019-08-01 DIAGNOSIS — E66811 Obesity, class 1: Secondary | ICD-10-CM

## 2019-08-01 DIAGNOSIS — E669 Obesity, unspecified: Secondary | ICD-10-CM

## 2019-08-01 DIAGNOSIS — N469 Male infertility, unspecified: Secondary | ICD-10-CM

## 2019-08-01 NOTE — Patient Instructions (Signed)

## 2019-08-01 NOTE — Progress Notes (Signed)
Office Note 08/01/2019  CC:  Chief Complaint  Patient presents with  . Establish Care    [place] P[place] in [place]   HPI:  Marcus Austin is a 28 y.o. male who is here to establish care. Patient's most recent primary MD: see above. Old records in EPIC/[facility] EMR were reviewed prior to or during today's visit.  No signif past med problems. Unknown time of last labs (years ago).  He and wife have been trying to have children x 3 yrs w/no results. His mom told him he had XYY-->not clear if this was determined by med testing or not.  Pt not clear on how his mom may have known this. Wife with PCOS.    Past Medical History:  Diagnosis Date  . Closed left tibial fracture 06/17/2015    Past Surgical History:  Procedure Laterality Date  . TIBIA IM NAIL INSERTION Left 06/18/2015   Procedure: INTRAMEDULLARY (IM) NAIL TIBIAL;  Surgeon: Teryl LucyJoshua Landau, MD;  Location: [facility] OR;  Service: Orthopedics;  Laterality: Left;    Family History  Problem Relation Age of Onset  . Arthritis Mother   . Arthritis Father   . Diabetes Father   . Kidney disease Father     Social History   Socioeconomic History  . Marital status: Single    Spouse name: Not on file  . Number of children: Not on file  . Years of education: Not on file  . Highest education level: Not on file  Occupational History  . Not on file  Social Needs  . Financial resource strain: Not on file  . Food insecurity    Worry: Not on file    Inability: Not on file  . Transportation needs    Medical: Not on file    Non-medical: Not on file  Tobacco Use  . Smoking status: Never Smoker  . Smokeless tobacco: Current User    Types: Snuff  Substance and Sexual Activity  . Alcohol use: Yes    Comment: Occasionally  . Drug use: No  . Sexual activity: Yes  Lifestyle  . Physical activity    Days per week: Not on file    Minutes per session: Not on file  . Stress: Not on file  Relationships  . Social Manufacturing systems engineerconnections   Talks on phone: Not on file    Gets together: Not on file    Attends religious service: Not on file    Active member of club or organization: Not on file    Attends meetings of clubs or organizations: Not on file    Relationship status: Not on file  . Intimate partner violence    Fear of current or ex partner: Not on file    Emotionally abused: Not on file    Physically abused: Not on file    Forced sexual activity: Not on file  Other Topics Concern  . Not on file  Social History Narrative   Married, no children.   Raised in [place], [place].   Educ: some college.   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   WUJ:WJXBTob:none   Alc: 6 pack/week    Outpatient Encounter Medications as of 08/01/2019  Medication Sig  . [DISCONTINUED] aspirin EC 325 MG tablet Take 1 tablet (325 mg total) by mouth daily. (Patient not taking: Reported on 08/01/2019)  . [DISCONTINUED] baclofen (LIORESAL) 10 MG tablet Take 1 tablet (10 mg total) by mouth 3 (three) times daily. As needed for muscle spasm (Patient not taking:  Reported on 08/01/2019)  . [DISCONTINUED] ondansetron (ZOFRAN) 4 MG tablet Take 1 tablet (4 mg total) by mouth every 8 (eight) hours as needed for nausea or vomiting. (Patient not taking: Reported on 08/01/2019)  . [DISCONTINUED] oxyCODONE-acetaminophen (ROXICET) 5-325 MG per tablet Take 1-2 tablets by mouth every 6 (six) hours as needed for severe pain. (Patient not taking: Reported on 08/01/2019)  . [DISCONTINUED] sennosides-docusate sodium (SENOKOT-S) 8.6-50 MG tablet Take 2 tablets by mouth daily. (Patient not taking: Reported on 08/01/2019)   No facility-administered encounter medications on file as of 08/01/2019.     Allergies  Allergen Reactions  . Sulfa Antibiotics Swelling  . Latex Swelling and Rash    ROS Review of Systems  Constitutional: Negative for appetite change, chills, fatigue and fever.  HENT: Negative for congestion, dental problem, ear pain and sore throat.   Eyes: Negative for  discharge, redness and visual disturbance.  Respiratory: Negative for cough, chest tightness, shortness of breath and wheezing.   Cardiovascular: Negative for chest pain, palpitations and leg swelling.  Gastrointestinal: Negative for abdominal pain, blood in stool, diarrhea, nausea and vomiting.  Genitourinary: Negative for difficulty urinating, dysuria, flank pain, frequency, hematuria and urgency.  Musculoskeletal: Negative for arthralgias, back pain, joint swelling, myalgias and neck stiffness.  Skin: Negative for pallor and rash.  Neurological: Negative for dizziness, speech difficulty, weakness and headaches.  Hematological: Negative for adenopathy. Does not bruise/bleed easily.  Psychiatric/Behavioral: Negative for confusion and sleep disturbance. The patient is not nervous/anxious.     PE; Blood pressure 130/82, pulse 72, temperature 98.5 F (36.9 C), temperature source Temporal, resp. rate 16, height 6' 2.5" (1.892 m), weight 246 lb (111.6 kg), SpO2 98 %. Body mass index is 31.16 kg/m.  Gen: Alert, well appearing.  Patient is oriented to person, place, time, and situation. AFFECT: pleasant, lucid thought and speech. ENT: Ears: EACs clear, normal epithelium.  TMs with good light reflex and landmarks bilaterally.  Eyes: no injection, icteris, swelling, or exudate.  EOMI, PERRLA. Nose: no drainage or turbinate edema/swelling.  No injection or focal lesion.  Mouth: lips without lesion/swelling.  Oral mucosa pink and moist.  Dentition intact and without obvious caries or gingival swelling.  Oropharynx without erythema, exudate, or swelling.  Neck: supple/nontender.  No LAD, mass, or TM.  Carotid pulses 2+ bilaterally, without bruits. CV: RRR, no m/r/g.   LUNGS: CTA bilat, nonlabored resps, good aeration in all lung fields. ABD: soft, NT, ND, BS normal.  No hepatospenomegaly or mass.  No bruits. EXT: no clubbing, cyanosis, or edema.  Musculoskeletal: no joint swelling, erythema, warmth,  or tenderness.  ROM of all joints intact. Skin - no sores or suspicious lesions or rashes or color changes   Pertinent labs:  No results found for: TSH Lab Results  Component Value Date   WBC 8.8 06/18/2015   HGB 13.4 06/18/2015   HCT 39.4 06/18/2015   MCV 83.7 06/18/2015   PLT 171 06/18/2015   Lab Results  Component Value Date   CREATININE 0.96 06/18/2015   BUN 19 06/17/2015   NA 138 06/17/2015   K 3.9 06/17/2015   CL 108 06/17/2015   CO2 23 06/17/2015   No results found for: ALT, AST, GGT, ALKPHOS, BILITOT No results found for: CHOL No results found for: HDL No results found for: LDLCALC No results found for: TRIG No results found for: CHOLHDL No results found for: PSA  No results found for: HGBA1C   ASSESSMENT AND PLAN:   New pt; will  request prior PCP records.  1) Health maintenance exam: Reviewed age and gender appropriate health maintenance issues (prudent diet, regular exercise, health risks of tobacco and excessive alcohol, use of seatbelts, fire alarms in home, use of sunscreen).  Also reviewed age and gender appropriate health screening as well as vaccine recommendations. Vaccines: none today.  Recommended flu vaccine around Oct. Labs: future fasting [facility] labs.  2) Decreased libido, question of infertility issues. Will refer to urology for eval of this.  An After Visit Summary was printed and given to the patient.  Return in about 1 year (around 07/31/2020) for annual CPE (fasting).  Signed:  Crissie Sickles, MD           08/01/2019

## 2019-08-04 ENCOUNTER — Ambulatory Visit: Payer: 59

## 2019-08-28 ENCOUNTER — Ambulatory Visit (INDEPENDENT_AMBULATORY_CARE_PROVIDER_SITE_OTHER): Payer: 59

## 2019-08-28 ENCOUNTER — Other Ambulatory Visit: Payer: Self-pay

## 2019-08-28 DIAGNOSIS — E669 Obesity, unspecified: Secondary | ICD-10-CM

## 2019-08-28 LAB — COMPREHENSIVE METABOLIC PANEL
ALT: 24 U/L (ref 0–53)
AST: 18 U/L (ref 0–37)
Albumin: 4.6 g/dL (ref 3.5–5.2)
Alkaline Phosphatase: 61 U/L (ref 39–117)
BUN: 16 mg/dL (ref 6–23)
CO2: 29 mEq/L (ref 19–32)
Calcium: 9.6 mg/dL (ref 8.4–10.5)
Chloride: 103 mEq/L (ref 96–112)
Creatinine, Ser: 0.94 mg/dL (ref 0.40–1.50)
GFR: 94.17 mL/min (ref >60.00)
Glucose, Bld: 97 mg/dL (ref 70–99)
Potassium: 4.4 mEq/L (ref 3.5–5.1)
Sodium: 140 mEq/L (ref 135–145)
Total Bilirubin: 0.7 mg/dL (ref 0.2–1.2)
Total Protein: 7.8 g/dL (ref 6.0–8.3)

## 2019-08-28 LAB — LIPID PANEL
Cholesterol: 168 mg/dL (ref 0–200)
HDL: 46.8 mg/dL (ref >39.00)
LDL Cholesterol: 102 mg/dL — ABNORMAL HIGH (ref 0–99)
NonHDL: 121.08
Total CHOL/HDL Ratio: 4
Triglycerides: 95 mg/dL (ref 0.0–149.0)
VLDL: 19 mg/dL (ref 0.0–40.0)

## 2019-08-28 LAB — CBC WITH DIFFERENTIAL/PLATELET
Basophils Absolute: 0 10*3/uL (ref 0.0–0.1)
Basophils Relative: 0.5 % (ref 0.0–3.0)
Eosinophils Absolute: 0.4 10*3/uL (ref 0.0–0.7)
Eosinophils Relative: 6.1 % — ABNORMAL HIGH (ref 0.0–5.0)
HCT: 48.5 % (ref 39.0–52.0)
Hemoglobin: 16.3 g/dL (ref 13.0–17.0)
Lymphocytes Relative: 32.5 % (ref 12.0–46.0)
Lymphs Abs: 2.1 10*3/uL (ref 0.7–4.0)
MCHC: 33.7 g/dL (ref 30.0–36.0)
MCV: 86.3 fl (ref 78.0–100.0)
Monocytes Absolute: 0.4 10*3/uL (ref 0.1–1.0)
Monocytes Relative: 6.7 % (ref 3.0–12.0)
Neutro Abs: 3.5 10*3/uL (ref 1.4–7.7)
Neutrophils Relative %: 54.2 % (ref 43.0–77.0)
Platelets: 194 10*3/uL (ref 150.0–400.0)
RBC: 5.62 Mil/uL (ref 4.22–5.81)
RDW: 13.6 % (ref 11.5–15.5)
WBC: 6.4 10*3/uL (ref 4.0–10.5)

## 2019-08-28 LAB — TSH: TSH: 0.62 u[IU]/mL (ref 0.35–4.50)

## 2019-09-03 DIAGNOSIS — Z3141 Encounter for fertility testing: Secondary | ICD-10-CM | POA: Diagnosis not present

## 2019-09-08 DIAGNOSIS — Z3141 Encounter for fertility testing: Secondary | ICD-10-CM | POA: Diagnosis not present

## 2019-10-07 ENCOUNTER — Encounter: Payer: Self-pay | Admitting: Family Medicine

## 2020-07-26 DIAGNOSIS — Z23 Encounter for immunization: Secondary | ICD-10-CM | POA: Diagnosis not present

## 2020-08-16 DIAGNOSIS — Z23 Encounter for immunization: Secondary | ICD-10-CM | POA: Diagnosis not present

## 2020-10-19 DIAGNOSIS — M9904 Segmental and somatic dysfunction of sacral region: Secondary | ICD-10-CM | POA: Diagnosis not present

## 2020-10-19 DIAGNOSIS — M791 Myalgia, unspecified site: Secondary | ICD-10-CM | POA: Diagnosis not present

## 2020-10-19 DIAGNOSIS — M9902 Segmental and somatic dysfunction of thoracic region: Secondary | ICD-10-CM | POA: Diagnosis not present

## 2020-10-19 DIAGNOSIS — M9901 Segmental and somatic dysfunction of cervical region: Secondary | ICD-10-CM | POA: Diagnosis not present

## 2020-10-19 DIAGNOSIS — M9903 Segmental and somatic dysfunction of lumbar region: Secondary | ICD-10-CM | POA: Diagnosis not present

## 2020-10-19 DIAGNOSIS — M531 Cervicobrachial syndrome: Secondary | ICD-10-CM | POA: Diagnosis not present

## 2020-10-26 DIAGNOSIS — M791 Myalgia, unspecified site: Secondary | ICD-10-CM | POA: Diagnosis not present

## 2020-10-26 DIAGNOSIS — M9904 Segmental and somatic dysfunction of sacral region: Secondary | ICD-10-CM | POA: Diagnosis not present

## 2020-10-26 DIAGNOSIS — M9902 Segmental and somatic dysfunction of thoracic region: Secondary | ICD-10-CM | POA: Diagnosis not present

## 2020-10-26 DIAGNOSIS — M9901 Segmental and somatic dysfunction of cervical region: Secondary | ICD-10-CM | POA: Diagnosis not present

## 2020-10-26 DIAGNOSIS — M9903 Segmental and somatic dysfunction of lumbar region: Secondary | ICD-10-CM | POA: Diagnosis not present

## 2020-10-26 DIAGNOSIS — M531 Cervicobrachial syndrome: Secondary | ICD-10-CM | POA: Diagnosis not present

## 2020-11-02 DIAGNOSIS — M791 Myalgia, unspecified site: Secondary | ICD-10-CM | POA: Diagnosis not present

## 2020-11-02 DIAGNOSIS — M531 Cervicobrachial syndrome: Secondary | ICD-10-CM | POA: Diagnosis not present

## 2020-11-02 DIAGNOSIS — M9903 Segmental and somatic dysfunction of lumbar region: Secondary | ICD-10-CM | POA: Diagnosis not present

## 2020-11-02 DIAGNOSIS — M9902 Segmental and somatic dysfunction of thoracic region: Secondary | ICD-10-CM | POA: Diagnosis not present

## 2020-11-02 DIAGNOSIS — M9904 Segmental and somatic dysfunction of sacral region: Secondary | ICD-10-CM | POA: Diagnosis not present

## 2020-11-02 DIAGNOSIS — M9901 Segmental and somatic dysfunction of cervical region: Secondary | ICD-10-CM | POA: Diagnosis not present

## 2020-11-11 DIAGNOSIS — M9901 Segmental and somatic dysfunction of cervical region: Secondary | ICD-10-CM | POA: Diagnosis not present

## 2020-11-11 DIAGNOSIS — M9904 Segmental and somatic dysfunction of sacral region: Secondary | ICD-10-CM | POA: Diagnosis not present

## 2020-11-11 DIAGNOSIS — M791 Myalgia, unspecified site: Secondary | ICD-10-CM | POA: Diagnosis not present

## 2020-11-11 DIAGNOSIS — M9902 Segmental and somatic dysfunction of thoracic region: Secondary | ICD-10-CM | POA: Diagnosis not present

## 2020-11-11 DIAGNOSIS — M9903 Segmental and somatic dysfunction of lumbar region: Secondary | ICD-10-CM | POA: Diagnosis not present

## 2020-11-11 DIAGNOSIS — M531 Cervicobrachial syndrome: Secondary | ICD-10-CM | POA: Diagnosis not present

## 2020-11-14 ENCOUNTER — Other Ambulatory Visit: Payer: Self-pay

## 2020-11-14 ENCOUNTER — Emergency Department (HOSPITAL_COMMUNITY)
Admission: EM | Admit: 2020-11-14 | Discharge: 2020-11-14 | Disposition: A | Discharge status: Home / Self Care | Payer: 59 | Attending: Emergency Medicine | Admitting: Emergency Medicine

## 2020-11-14 ENCOUNTER — Emergency Department (HOSPITAL_COMMUNITY): Payer: 59

## 2020-11-14 ENCOUNTER — Encounter (HOSPITAL_COMMUNITY): Payer: Self-pay | Admitting: Emergency Medicine

## 2020-11-14 DIAGNOSIS — Y92410 Unspecified street and highway as the place of occurrence of the external cause: Secondary | ICD-10-CM | POA: Insufficient documentation

## 2020-11-14 DIAGNOSIS — M23322 Other meniscus derangements, posterior horn of medial meniscus, left knee: Secondary | ICD-10-CM | POA: Diagnosis not present

## 2020-11-14 DIAGNOSIS — Z91040 Latex allergy status: Secondary | ICD-10-CM | POA: Insufficient documentation

## 2020-11-14 DIAGNOSIS — R112 Nausea with vomiting, unspecified: Secondary | ICD-10-CM | POA: Diagnosis not present

## 2020-11-14 DIAGNOSIS — S43121A Dislocation of right acromioclavicular joint, 100%-200% displacement, initial encounter: Secondary | ICD-10-CM | POA: Diagnosis not present

## 2020-11-14 DIAGNOSIS — R519 Headache, unspecified: Secondary | ICD-10-CM | POA: Diagnosis not present

## 2020-11-14 DIAGNOSIS — S43101A Unspecified dislocation of right acromioclavicular joint, initial encounter: Secondary | ICD-10-CM | POA: Insufficient documentation

## 2020-11-14 DIAGNOSIS — S83242A Other tear of medial meniscus, current injury, left knee, initial encounter: Secondary | ICD-10-CM | POA: Diagnosis not present

## 2020-11-14 DIAGNOSIS — S4991XA Unspecified injury of right shoulder and upper arm, initial encounter: Secondary | ICD-10-CM | POA: Diagnosis not present

## 2020-11-14 DIAGNOSIS — M25562 Pain in left knee: Secondary | ICD-10-CM | POA: Diagnosis not present

## 2020-11-14 MED ORDER — FENTANYL CITRATE (PF) 100 MCG/2ML IJ SOLN
50.0000 ug | Freq: Once | INTRAMUSCULAR | Status: AC
  Administered 2020-11-14: 50 ug via INTRAMUSCULAR
  Filled 2020-11-14: qty 2
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
## 2020-11-14 NOTE — Progress Notes (Signed)
Orthopedic Tech Progress Note Patient Details:  Marcus Austin 11-Mar-1991 [id number]  Ortho Devices Ortho Device/Splint Location: applied shoulder immobilizer to RUE Ortho Device/Splint Interventions: Ordered, Application   Post Interventions Patient Tolerated: Well Instructions Provided: Care of device   Jennye Moccasin 11/14/2020, 2:13 PM
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
## 2020-11-14 NOTE — ED Notes (Signed)
Patient was called for triage but no response.  

## 2020-11-16 ENCOUNTER — Ambulatory Visit (INDEPENDENT_AMBULATORY_CARE_PROVIDER_SITE_OTHER): Payer: 59

## 2020-11-16 ENCOUNTER — Encounter: Payer: Self-pay | Admitting: Family

## 2020-11-16 ENCOUNTER — Ambulatory Visit (INDEPENDENT_AMBULATORY_CARE_PROVIDER_SITE_OTHER): Payer: 59 | Admitting: Orthopaedic Surgery

## 2020-11-16 ENCOUNTER — Other Ambulatory Visit: Payer: Self-pay

## 2020-11-16 ENCOUNTER — Encounter (HOSPITAL_BASED_OUTPATIENT_CLINIC_OR_DEPARTMENT_OTHER): Payer: Self-pay | Admitting: Orthopaedic Surgery

## 2020-11-16 DIAGNOSIS — M25511 Pain in right shoulder: Secondary | ICD-10-CM | POA: Diagnosis not present
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
## 2020-11-17 ENCOUNTER — Other Ambulatory Visit: Payer: Self-pay

## 2020-11-22 ENCOUNTER — Other Ambulatory Visit (HOSPITAL_COMMUNITY)
Admission: RE | Admit: 2020-11-22 | Discharge: 2020-11-22 | Disposition: A | Discharge status: Home / Self Care | Payer: 59 | Source: Ambulatory Visit | Attending: Orthopaedic Surgery | Admitting: Orthopaedic Surgery

## 2020-11-22 DIAGNOSIS — Z20822 Contact with and (suspected) exposure to covid-19: Secondary | ICD-10-CM | POA: Insufficient documentation

## 2020-11-22 DIAGNOSIS — Z01812 Encounter for preprocedural laboratory examination: Secondary | ICD-10-CM | POA: Insufficient documentation

## 2020-11-22 LAB — SARS CORONAVIRUS 2 (TAT 6-24 HRS): SARS Coronavirus 2: NEGATIVE

## 2020-11-24 ENCOUNTER — Other Ambulatory Visit: Payer: Self-pay

## 2020-11-24 ENCOUNTER — Encounter (HOSPITAL_BASED_OUTPATIENT_CLINIC_OR_DEPARTMENT_OTHER): Payer: Self-pay | Admitting: Orthopaedic Surgery

## 2020-11-24 ENCOUNTER — Encounter (HOSPITAL_BASED_OUTPATIENT_CLINIC_OR_DEPARTMENT_OTHER)
Admission: RE | Disposition: A | Discharge status: Home / Self Care | Payer: Self-pay | Source: Home / Self Care | Attending: Orthopaedic Surgery

## 2020-11-24 ENCOUNTER — Other Ambulatory Visit (HOSPITAL_BASED_OUTPATIENT_CLINIC_OR_DEPARTMENT_OTHER): Payer: Self-pay | Admitting: Orthopaedic Surgery

## 2020-11-24 ENCOUNTER — Ambulatory Visit (HOSPITAL_BASED_OUTPATIENT_CLINIC_OR_DEPARTMENT_OTHER): Payer: 59 | Admitting: Anesthesiology

## 2020-11-24 ENCOUNTER — Ambulatory Visit (HOSPITAL_BASED_OUTPATIENT_CLINIC_OR_DEPARTMENT_OTHER)
Admission: RE | Admit: 2020-11-24 | Discharge: 2020-11-24 | Disposition: A | Discharge status: Home / Self Care | Payer: 59 | Attending: Orthopaedic Surgery | Admitting: Orthopaedic Surgery

## 2020-11-24 ENCOUNTER — Ambulatory Visit (HOSPITAL_COMMUNITY): Payer: 59

## 2020-11-24 DIAGNOSIS — S43121A Dislocation of right acromioclavicular joint, 100%-200% displacement, initial encounter: Secondary | ICD-10-CM | POA: Diagnosis not present

## 2020-11-24 DIAGNOSIS — X58XXXA Exposure to other specified factors, initial encounter: Secondary | ICD-10-CM | POA: Insufficient documentation

## 2020-11-24 DIAGNOSIS — S43101A Unspecified dislocation of right acromioclavicular joint, initial encounter: Secondary | ICD-10-CM

## 2020-11-24 DIAGNOSIS — G8918 Other acute postprocedural pain: Secondary | ICD-10-CM | POA: Diagnosis not present

## 2020-11-24 DIAGNOSIS — M4316 Spondylolisthesis, lumbar region: Secondary | ICD-10-CM | POA: Diagnosis not present

## 2020-11-24 DIAGNOSIS — S43121D Dislocation of right acromioclavicular joint, 100%-200% displacement, subsequent encounter: Secondary | ICD-10-CM | POA: Diagnosis not present

## 2020-11-24 DIAGNOSIS — Z419 Encounter for procedure for purposes other than remedying health state, unspecified: Secondary | ICD-10-CM

## 2020-11-24 HISTORY — DX: Anxiety disorder, unspecified: F41.9

## 2020-11-24 HISTORY — PX: ACROMIO-CLAVICULAR JOINT REPAIR: SHX5183

## 2020-11-24 SURGERY — REPAIR, ACROMIOCLAVICULAR JOINT
Anesthesia: General | Site: Shoulder | Laterality: Right

## 2020-11-24 MED ORDER — CEFAZOLIN SODIUM-DEXTROSE 2-4 GM/100ML-% IV SOLN
INTRAVENOUS | Status: AC
  Filled 2020-11-24: qty 100

## 2020-11-24 MED ORDER — MIDAZOLAM HCL 5 MG/5ML IJ SOLN
INTRAMUSCULAR | Status: DC | PRN
  Administered 2020-11-24 (×2): 1 mg via INTRAVENOUS

## 2020-11-24 MED ORDER — PROPOFOL 10 MG/ML IV BOLUS
INTRAVENOUS | Status: AC
  Filled 2020-11-24: qty 20

## 2020-11-24 MED ORDER — SODIUM CHLORIDE 0.9 % IR SOLN
Status: DC | PRN
  Administered 2020-11-24: 1

## 2020-11-24 MED ORDER — PROPOFOL 10 MG/ML IV BOLUS
INTRAVENOUS | Status: DC | PRN
  Administered 2020-11-24: 200 mg via INTRAVENOUS

## 2020-11-24 MED ORDER — MIDAZOLAM HCL 2 MG/2ML IJ SOLN
2.0000 mg | Freq: Once | INTRAMUSCULAR | Status: AC
  Administered 2020-11-24: 2 mg via INTRAVENOUS

## 2020-11-24 MED ORDER — LIDOCAINE 2% (20 MG/ML) 5 ML SYRINGE
INTRAMUSCULAR | Status: AC
  Filled 2020-11-24: qty 5

## 2020-11-24 MED ORDER — FENTANYL CITRATE (PF) 100 MCG/2ML IJ SOLN
INTRAMUSCULAR | Status: AC
  Filled 2020-11-24: qty 2

## 2020-11-24 MED ORDER — DEXAMETHASONE SODIUM PHOSPHATE 4 MG/ML IJ SOLN
INTRAMUSCULAR | Status: DC | PRN
  Administered 2020-11-24: 5 mg via INTRAVENOUS

## 2020-11-24 MED ORDER — MIDAZOLAM HCL 2 MG/2ML IJ SOLN
INTRAMUSCULAR | Status: AC
  Filled 2020-11-24: qty 2

## 2020-11-24 MED ORDER — PROMETHAZINE HCL 25 MG/ML IJ SOLN: 6.2500 mg | INTRAMUSCULAR | Status: DC | PRN

## 2020-11-24 MED ORDER — LIDOCAINE HCL (CARDIAC) PF 100 MG/5ML IV SOSY
PREFILLED_SYRINGE | INTRAVENOUS | Status: DC | PRN
  Administered 2020-11-24: 60 mg via INTRAVENOUS

## 2020-11-24 MED ORDER — ONDANSETRON HCL 4 MG/2ML IJ SOLN
INTRAMUSCULAR | Status: AC
  Filled 2020-11-24: qty 2

## 2020-11-24 MED ORDER — OXYCODONE HCL 5 MG/5ML PO SOLN: 5.0000 mg | Freq: Once | ORAL | Status: DC | PRN

## 2020-11-24 MED ORDER — DEXAMETHASONE SODIUM PHOSPHATE 10 MG/ML IJ SOLN
INTRAMUSCULAR | Status: AC
  Filled 2020-11-24: qty 1

## 2020-11-24 MED ORDER — BUPIVACAINE LIPOSOME 1.3 % IJ SUSP
INTRAMUSCULAR | Status: DC | PRN
  Administered 2020-11-24: 10 mL via PERINEURAL

## 2020-11-24 MED ORDER — MEPERIDINE HCL 25 MG/ML IJ SOLN: 6.2500 mg | INTRAMUSCULAR | Status: DC | PRN

## 2020-11-24 MED ORDER — AMISULPRIDE (ANTIEMETIC) 5 MG/2ML IV SOLN: 10.0000 mg | Freq: Once | INTRAVENOUS | Status: DC | PRN

## 2020-11-24 MED ORDER — OXYCODONE HCL 5 MG PO TABS: 5.0000 mg | ORAL_TABLET | Freq: Once | ORAL | Status: DC | PRN

## 2020-11-24 MED ORDER — BUPIVACAINE-EPINEPHRINE 0.5% -1:200000 IJ SOLN
INTRAMUSCULAR | Status: DC | PRN
  Administered 2020-11-24: 20 mL

## 2020-11-24 MED ORDER — FENTANYL CITRATE (PF) 100 MCG/2ML IJ SOLN
INTRAMUSCULAR | Status: DC | PRN
  Administered 2020-11-24: 25 ug via INTRAVENOUS
  Administered 2020-11-24: 50 ug via INTRAVENOUS
  Administered 2020-11-24: 25 ug via INTRAVENOUS

## 2020-11-24 MED ORDER — OXYCODONE-ACETAMINOPHEN 5-325 MG PO TABS
1.0000 | ORAL_TABLET | Freq: Three times a day (TID) | ORAL | 0 refills | Status: DC | PRN
Start: 2020-11-24 — End: 2020-11-24

## 2020-11-24 MED ORDER — FENTANYL CITRATE (PF) 100 MCG/2ML IJ SOLN
100.0000 ug | Freq: Once | INTRAMUSCULAR | Status: AC
  Administered 2020-11-24: 100 ug via INTRAVENOUS

## 2020-11-24 MED ORDER — METHOCARBAMOL 750 MG PO TABS: 750.0000 mg | ORAL_TABLET | Freq: Two times a day (BID) | ORAL | 3 refills | Status: DC | PRN

## 2020-11-24 MED ORDER — CEFAZOLIN SODIUM-DEXTROSE 2-4 GM/100ML-% IV SOLN
2.0000 g | INTRAVENOUS | Status: AC
  Administered 2020-11-24: 2 g via INTRAVENOUS

## 2020-11-24 MED ORDER — LACTATED RINGERS IV SOLN: INTRAVENOUS | Status: DC

## 2020-11-24 MED ORDER — VANCOMYCIN HCL 500 MG IV SOLR
INTRAVENOUS | Status: DC | PRN
  Administered 2020-11-24: 500 mg via TOPICAL

## 2020-11-24 MED ORDER — BUPIVACAINE HCL (PF) 0.5 % IJ SOLN
INTRAMUSCULAR | Status: DC | PRN
  Administered 2020-11-24: 20 mL via PERINEURAL

## 2020-11-24 MED ORDER — HYDROMORPHONE HCL 1 MG/ML IJ SOLN: 0.2500 mg | INTRAMUSCULAR | Status: DC | PRN

## 2020-11-24 MED ORDER — ONDANSETRON HCL 4 MG/2ML IJ SOLN
INTRAMUSCULAR | Status: DC | PRN
  Administered 2020-11-24: 4 mg via INTRAVENOUS

## 2020-11-24 MED FILL — METHOCARBAMOL 750 MG TABS: 750 | 10 days supply | Qty: 20 | Fill #0

## 2020-11-24 MED FILL — OXYCODONE-APAP 5-325MG: 5-325 | 5 days supply | Qty: 30 | Fill #0

## 2020-11-24 SURGICAL SUPPLY — 57 items
BLADE SURG 15 STRL LF DISP TIS (BLADE) ×2 IMPLANT
BLADE SURG 15 STRL SS (BLADE) ×6
CLOSURE STERI-STRIP 1/2X4 (GAUZE/BANDAGES/DRESSINGS) ×1
CLOSURE WOUND 1/2 X4 (GAUZE/BANDAGES/DRESSINGS)
CLSR STERI-STRIP ANTIMIC 1/2X4 (GAUZE/BANDAGES/DRESSINGS) ×2 IMPLANT
COVER SURGICAL LIGHT HANDLE (MISCELLANEOUS) ×3 IMPLANT
DRAPE C-ARM 42X72 X-RAY (DRAPES) ×3 IMPLANT
DRAPE IMP U-DRAPE 54X76 (DRAPES) ×3 IMPLANT
DRAPE INCISE IOBAN 66X45 STRL (DRAPES) ×3 IMPLANT
DRAPE SURG 17X23 STRL (DRAPES) ×3 IMPLANT
DRAPE U-SHAPE 47X51 STRL (DRAPES) ×3 IMPLANT
DRAPE U-SHAPE 76X120 STRL (DRAPES) ×6 IMPLANT
DRSG MEPILEX BORDER 4X8 (GAUZE/BANDAGES/DRESSINGS) ×3 IMPLANT
DRSG PAD ABDOMINAL 8X10 ST (GAUZE/BANDAGES/DRESSINGS) ×3 IMPLANT
DRSG TEGADERM 4X10 (GAUZE/BANDAGES/DRESSINGS) ×3 IMPLANT
DURAPREP 26ML APPLICATOR (WOUND CARE) ×3 IMPLANT
ELECT REM PT RETURN 9FT ADLT (ELECTROSURGICAL) ×3
ELECTRODE REM PT RTRN 9FT ADLT (ELECTROSURGICAL) ×1 IMPLANT
GAUZE SPONGE 4X4 12PLY STRL (GAUZE/BANDAGES/DRESSINGS) ×3 IMPLANT
GLOVE BIOGEL PI IND STRL 7.0 (GLOVE) ×1 IMPLANT
GLOVE BIOGEL PI INDICATOR 7.0 (GLOVE) ×2
GLOVE SKINSENSE NS SZ7.5 (GLOVE) ×2
GLOVE SKINSENSE STRL SZ7.5 (GLOVE) ×1 IMPLANT
GLOVE SURG SYN 7.5  E (GLOVE) ×6
GLOVE SURG SYN 7.5 E (GLOVE) ×3 IMPLANT
GOWN STRL REIN XL XLG (GOWN DISPOSABLE) ×6 IMPLANT
GOWN STRL REUS W/ TWL LRG LVL3 (GOWN DISPOSABLE) ×1 IMPLANT
GOWN STRL REUS W/TWL LRG LVL3 (GOWN DISPOSABLE) ×3
KIT AC JOINT DISP (KITS) ×3 IMPLANT
MANIFOLD NEPTUNE II (INSTRUMENTS) ×3 IMPLANT
PACK ARTHROSCOPY DSU (CUSTOM PROCEDURE TRAY) ×3 IMPLANT
PACK BASIN DAY SURGERY FS (CUSTOM PROCEDURE TRAY) ×3 IMPLANT
PENCIL SMOKE EVACUATOR (MISCELLANEOUS) ×3 IMPLANT
RETRIEVER SUT HEWSON (MISCELLANEOUS) IMPLANT
SHEET MEDIUM DRAPE 40X70 STRL (DRAPES) ×3 IMPLANT
SLEEVE SCD COMPRESS KNEE MED (MISCELLANEOUS) ×3 IMPLANT
SLING ARM FOAM STRAP LRG (SOFTGOODS) ×3 IMPLANT
SPONGE LAP 18X18 RF (DISPOSABLE) ×6 IMPLANT
STRIP CLOSURE SKIN 1/2X4 (GAUZE/BANDAGES/DRESSINGS) IMPLANT
SUCTION FRAZIER HANDLE 10FR (MISCELLANEOUS) ×2
SUCTION TUBE FRAZIER 10FR DISP (MISCELLANEOUS) ×1 IMPLANT
SUT FIBERWIRE #2 38 T-5 BLUE (SUTURE)
SUT MNCRL AB 3-0 PS2 18 (SUTURE) ×3 IMPLANT
SUT MNCRL AB 4-0 PS2 18 (SUTURE) IMPLANT
SUT VIC AB 0 CT1 27 (SUTURE)
SUT VIC AB 0 CT1 27XBRD ANBCTR (SUTURE) IMPLANT
SUT VIC AB 1 CT1 27 (SUTURE)
SUT VIC AB 1 CT1 27XBRD ANBCTR (SUTURE) IMPLANT
SUT VIC AB 2-0 CT1 27 (SUTURE) ×9
SUT VIC AB 2-0 CT1 TAPERPNT 27 (SUTURE) ×3 IMPLANT
SUTURE FIBERWR #2 38 T-5 BLUE (SUTURE) IMPLANT
SYR BULB EAR ULCER 3OZ GRN STR (SYRINGE) ×3 IMPLANT
TOWEL GREEN STERILE FF (TOWEL DISPOSABLE) ×3 IMPLANT
TUBE CONNECTING 20'X1/4 (TUBING)
TUBE CONNECTING 20X1/4 (TUBING) IMPLANT
YANKAUER SUCT BULB TIP NO VENT (SUCTIONS) ×3 IMPLANT
ZIPLOOP AC JOINT REPAIR (Orthopedic Implant) ×3 IMPLANT

## 2020-11-24 NOTE — Anesthesia Procedure Notes (Signed)
Procedure Name: LMA Insertion Date/Time: 11/24/2020 11:57 AM Performed by: Cleda Clarks, CRNA Pre-anesthesia Checklist: Patient identified, Emergency Drugs available, Suction available and Patient being monitored Patient Re-evaluated:Patient Re-evaluated prior to induction Oxygen Delivery Method: Circle system utilized Preoxygenation: Pre-oxygenation with 100% oxygen Induction Type: IV induction Ventilation: Mask ventilation without difficulty LMA: LMA inserted LMA Size: 5.0 Number of attempts: 1 Placement Confirmation: positive ETCO2 Tube secured with: Tape Dental Injury: Teeth and Oropharynx as per pre-operative assessment

## 2020-11-24 NOTE — Transfer of Care (Signed)
Immediate Anesthesia Transfer of Care Note  Patient: Marcus Austin  Procedure(s) Performed: RIGHT ACROMIOCLAVICULAR JOINT RECONSTRUCTION (Right Shoulder)  Patient Location: PACU  Anesthesia Type:General  Level of Consciousness: awake, alert  and oriented  Airway & Oxygen Therapy: Patient Spontanous Breathing and Patient connected to nasal cannula oxygen  Post-op Assessment: Report given to RN and Post -op Vital signs reviewed and stable  Post vital signs: Reviewed and stable  Last Vitals:  Vitals Value Taken Time  BP 148/85 11/24/20 1336  Temp    Pulse 87 11/24/20 1337  Resp 15 11/24/20 1337  SpO2 98 % 11/24/20 1337    Last Pain:  Vitals:   11/24/20 1110  TempSrc: Oral  PainSc: 0-No pain         Complications: No complications documented.

## 2020-11-24 NOTE — Anesthesia Preprocedure Evaluation (Signed)
Anesthesia Evaluation  Patient identified by MRN, date of birth, ID band Patient awake    Reviewed: Allergy & Precautions, NPO status , Patient's Chart, lab work & pertinent test results, reviewed documented beta blocker date and time   Airway Mallampati: I  TM Distance: >3 FB Neck ROM: Full    Dental no notable dental hx. (+) Teeth Intact, Dental Advisory Given   Pulmonary    Pulmonary exam normal breath sounds clear to auscultation       Cardiovascular Exercise Tolerance: Good Normal cardiovascular exam Rhythm:Regular Rate:Normal     Neuro/Psych Anxiety    GI/Hepatic   Endo/Other    Renal/GU      Musculoskeletal   Abdominal (+) + obese,   Peds  Hematology   Anesthesia Other Findings   Reproductive/Obstetrics                             Anesthesia Physical  Anesthesia Plan  ASA: II  Anesthesia Plan: General   Post-op Pain Management:  Regional for Post-op pain   Induction: Intravenous  PONV Risk Score and Plan: 2 and Ondansetron, Midazolam and Treatment may vary due to age or medical condition  Airway Management Planned: LMA  Additional Equipment:   Intra-op Plan:   Post-operative Plan: Extubation in OR  Informed Consent: I have reviewed the patients History and Physical, chart, labs and discussed the procedure including the risks, benefits and alternatives for the proposed anesthesia with the patient or authorized representative who has indicated his/her understanding and acceptance.     Dental advisory given  Plan Discussed with: CRNA  Anesthesia Plan Comments:         Anesthesia Quick Evaluation

## 2020-11-24 NOTE — Op Note (Signed)
   Date of Surgery: 11/24/2020  INDICATIONS: Mr. Yarbro is a 29 y.o.-year-old male with a right type 5 AC separation.  The patient did consent to the procedure after discussion of the risks and benefits.  PREOPERATIVE DIAGNOSIS: Right type 5 AC separation  POSTOPERATIVE DIAGNOSIS: Same.  PROCEDURE: Open treatment of right AC separation  SURGEON: N. Glee Arvin, M.D.  ASSIST: none  ANESTHESIA:  general, regional, local  IV FLUIDS AND URINE: See anesthesia.  ESTIMATED BLOOD LOSS: minimal mL.  IMPLANTS: Biomet ziptight  DRAINS: none  COMPLICATIONS: see description of procedure.  DESCRIPTION OF PROCEDURE: The patient was brought to the operating room.  The patient had been signed prior to the procedure and this was documented. The patient had the anesthesia placed by the anesthesiologist.  A time-out was performed to confirm that this was the correct patient, site, side and location. The patient did receive antibiotics prior to the incision and was re-dosed during the procedure as needed at indicated intervals.  The patient was placed in the beach chair position with all bony prominences well padded and neutral c spine.  The patient had the operative extremity prepped and draped in the standard surgical fashion.    A longitudinal incision was made over the superior shoulder from the coracoid to the distal clavicle and then posteriorly over the acromion.  Dissection was carried down through the subcutaneous tissue and full-thickness flaps were elevated off of the musculature.  Full-thickness muscle flaps were then elevated off of the distal clavicle and the acromion.  A Cobb elevator was used to elevate subperiosteally.  The [facility] joint was grossly dislocated superiorly.  We then bluntly dissected down inferiorly onto the coracoid.  [employer] was used to pull the distal clavicle posteriorly and the pectoralis muscle anteriorly in order to directly visualize the coracoid.  I then measured  approximately halfway between the 2 cc ligament attachments to the clavicle from the distal clavicle.  A pin was drilled bicortically through the clavicle down onto the coracoid in a bicortical fashion using fluoroscopic guidance.  The reamer was then drilled over the pin to the appropriate depth.  We then delivered the suture button with the attached sutures through the clavicle and the coracoid at which point the button was deployed and flipped and confirmed under fluoroscopy.  A superior button was then attached to the suture and the suture construct was then tensioned with the provided sutures until the [facility] joint was fully reduced which was confirmed under direct visualization and fluoroscopically.  The sutures were then cut short.  The surgical wound was then thoroughly irrigated.  Half a gram of vancomycin powder was placed in the surgical wound.  Layered closure was performed with 2-0 Vicryl and a running 3-0 Monocryl.  Steri-Strips were placed.  Sterile dressings were applied.  Shoulder sling placed.  Patient tolerated procedure well had no immediate complications.  POSTOPERATIVE PLAN: Patient will need to be nonweightbearing and remain in the sling at all times.  He may perform elbow and wrist range of motion as tolerated.  Follow-up in 2 weeks for wound check repeat x-rays the right AC joint.  Mayra Reel, MD 1:47 PM

## 2020-11-24 NOTE — H&P (Signed)
PREOPERATIVE H&P  Chief Complaint: Right shoulder acromioclavicular separation  HPI: Marcus Austin is a 29 y.o. male who presents for surgical treatment of Right shoulder acromioclavicular separation.  He denies any changes in medical history.  Past Medical History:  Diagnosis Date  . Acromioclavicular joint separation, right, initial encounter   . Anxiety    panic attacks  . Closed left tibial fracture 06/17/2015  . Decreased libido    question of infertility 07/2019 (although wife has PCOS): referred to [facility] for eval->Dr. Retta Diones 08/2019. Question of XYY chromosomal abnormality per pt.  . Obesity, Class I, BMI 30-34.9    Past Surgical History:  Procedure Laterality Date  . TIBIA IM NAIL INSERTION Left 06/18/2015   Procedure: INTRAMEDULLARY (IM) NAIL TIBIAL;  Surgeon: Teryl Lucy, MD;  Location: [facility] OR;  Service: Orthopedics;  Laterality: Left;   Social History   Socioeconomic History  . Marital status: Single    Spouse name: Not on file  . Number of children: Not on file  . Years of education: Not on file  . Highest education level: Not on file  Occupational History  . Not on file  Tobacco Use  . Smoking status: Never Smoker  . Smokeless tobacco: Former Neurosurgeon    Types: Snuff  Vaping Use  . Vaping Use: Never used  Substance and Sexual Activity  . Alcohol use: Yes    Comment: Occasionally  . Drug use: No  . Sexual activity: Yes  Other Topics Concern  . Not on file  Social History Narrative   Married, no children.   Raised in [place], [place].   Educ: some college.   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   NWG:NFAO   Alc: 6 pack/week   Social Determinants of Health   Financial Resource Strain:   . Difficulty of Paying Living Expenses: Not on file  Food Insecurity:   . Worried About Programme researcher, broadcasting/film/video in the Last Year: Not on file  . Ran Out of Food in the Last Year: Not on file  Transportation Needs:   . Lack of Transportation (Medical): Not on  file  . Lack of Transportation (Non-Medical): Not on file  Physical Activity:   . Days of Exercise per Week: Not on file  . Minutes of Exercise per Session: Not on file  Stress:   . Feeling of Stress : Not on file  Social Connections:   . Frequency of Communication with Friends and Family: Not on file  . Frequency of Social Gatherings with Friends and Family: Not on file  . Attends Religious Services: Not on file  . Active Member of Clubs or Organizations: Not on file  . Attends Banker Meetings: Not on file  . Marital Status: Not on file   Family History  Problem Relation Age of Onset  . Arthritis Mother   . Arthritis Father   . Diabetes Father   . Kidney disease Father    Allergies  Allergen Reactions  . Sulfa Antibiotics Swelling  . Latex Swelling and Rash   Prior to Admission medications   Medication Sig Start Date End Date Taking? Authorizing Provider  acetaminophen (TYLENOL) 325 MG tablet Take 650 mg by mouth every 6 (six) hours as needed.   Yes [provider]  ibuprofen (ADVIL) 200 MG tablet Take 400 mg by mouth every 6 (six) hours as needed.   Yes [provider]     Positive ROS: All other systems have been reviewed  and were otherwise negative with the exception of those mentioned in the HPI and as above.  Physical Exam: General: Alert, no acute distress Cardiovascular: No pedal edema Respiratory: No cyanosis, no use of accessory musculature GI: abdomen soft Skin: No lesions in the area of chief complaint Neurologic: Sensation intact distally Psychiatric: Patient is competent for consent with normal mood and affect Lymphatic: no lymphedema  MUSCULOSKELETAL: exam stable  Assessment: Right shoulder acromioclavicular separation  Plan: Plan for Procedure(s): RIGHT ACROMIOCLAVICULAR JOINT RECONSTRUCTION WITH GRAFT  The risks benefits and alternatives were discussed with the patient including but not limited to the risks of  nonoperative treatment, versus surgical intervention including infection, bleeding, nerve injury,  blood clots, cardiopulmonary complications, morbidity, mortality, among others, and they were willing to proceed.   Preoperative templating of the joint replacement has been completed, documented, and submitted to the Operating Room personnel in order to optimize intra-operative equipment management.   Glee Arvin, MD 11/24/2020 11:04 AM

## 2020-11-24 NOTE — Anesthesia Procedure Notes (Signed)
Anesthesia Regional Block: Interscalene brachial plexus block   Pre-Anesthetic Checklist: ,, timeout performed, Correct Patient, Correct Site, Correct Laterality, Correct Procedure, Correct Position, site marked, Risks and benefits discussed,  Surgical consent,  Pre-op evaluation,  At surgeon's request and post-op pain management  Laterality: Right  Prep: chloraprep       Needles:  Injection technique: Single-shot  Needle Type: Stimiplex     Needle Length: 9cm  Needle Gauge: 21     Additional Needles:   Procedures:,,,, ultrasound used (permanent image in chart),,,,  Narrative:  Start time: 11/24/2020 11:32 AM End time: 11/24/2020 11:37 AM Injection made incrementally with aspirations every 5 mL.  Performed by: Personally  Anesthesiologist: Lowella Curb, MD

## 2020-11-24 NOTE — Progress Notes (Signed)
Assisted Dr. Miller with right, ultrasound guided, interscalene  block. Side rails up, monitors on throughout procedure. See vital signs in flow sheet. Tolerated Procedure well. 

## 2020-11-24 NOTE — Discharge Instructions (Signed)
Post Anesthesia Home Care Instructions  Activity: Get plenty of rest for the remainder of the day. A responsible individual must stay with you for 24 hours following the procedure.  For the next 24 hours, DO NOT: -Drive a car -Paediatric nurse -Drink alcoholic beverages -Take any medication unless instructed by your physician -Make any legal decisions or sign important papers.  Meals: Start with liquid foods such as gelatin or soup. Progress to regular foods as tolerated. Avoid greasy, spicy, heavy foods. If nausea and/or vomiting occur, drink only clear liquids until the nausea and/or vomiting subsides. Call your physician if vomiting continues.  Special Instructions/Symptoms: Your throat may feel dry or sore from the anesthesia or the breathing tube placed in your throat during surgery. If this causes discomfort, gargle with warm salt water. The discomfort should disappear within 24 hours.  If you had a scopolamine patch placed behind your ear for the management of post- operative nausea and/or vomiting:  1. The medication in the patch is effective for 72 hours, after which it should be removed.  Wrap patch in a tissue and discard in the trash. Wash hands thoroughly with soap and water. 2. You may remove the patch earlier than 72 hours if you experience unpleasant side effects which may include dry mouth, dizziness or visual disturbances. 3. Avoid touching the patch. Wash your hands with soap and water after contact with the patch.       Regional Anesthesia Blocks  1. Numbness or the inability to move the "blocked" extremity may last from 3-48 hours after placement. The length of time depends on the medication injected and your individual response to the medication. If the numbness is not going away after 48 hours, call your surgeon.  2. The extremity that is blocked will need to be protected until the numbness is gone and the  Strength has returned. Because you cannot feel it, you  will need to take extra care to avoid injury. Because it may be weak, you may have difficulty moving it or using it. You may not know what position it is in without looking at it while the block is in effect.  3. For blocks in the legs and feet, returning to weight bearing and walking needs to be done carefully. You will need to wait until the numbness is entirely gone and the strength has returned. You should be able to move your leg and foot normally before you try and bear weight or walk. You will need someone to be with you when you first try to ensure you do not fall and possibly risk injury.  4. Bruising and tenderness at the needle site are common side effects and will resolve in a few days.  5. Persistent numbness or new problems with movement should be communicated to the surgeon or the [place] [phone] [place] [phone]).   Information for Discharge Teaching: EXPAREL (bupivacaine liposome injectable suspension)   Your surgeon or anesthesiologist gave you EXPAREL(bupivacaine) to help control your pain after surgery.   EXPAREL is a local anesthetic that provides pain relief by numbing the tissue around the surgical site.  EXPAREL is designed to release pain medication over time and can control pain for up to 72 hours.  Depending on how you respond to EXPAREL, you may require less pain medication during your recovery.  Possible side effects:  Temporary loss of sensation or ability to move in the area where bupivacaine was injected.  Nausea, vomiting, constipation  Rarely, numbness and tingling in your mouth or lips, lightheadedness, or anxiety may occur.  Call your doctor right away if you think you may be experiencing any of these sensations, or if you have other questions regarding possible side effects.  Follow all other discharge instructions given to you by your surgeon or nurse. Eat a healthy diet and drink plenty of water or  other fluids.  If you return to the hospital for any reason within 96 hours following the administration of EXPAREL, it is important for health care providers to know that you have received this anesthetic. A teal colored band has been placed on your arm with the date, time and amount of EXPAREL you have received in order to alert and inform your health care providers. Please leave this armband in place for the full 96 hours following administration, and then you may remove the band.     Postoperative instructions:  Weightbearing instructions: non weight bearing.  Wear sling at all times  Keep your dressing and/or splint clean and dry at all times.  You can remove your dressing on post-operative day #3 and change with a dry/sterile dressing or Band-Aids as needed thereafter.    Incision instructions:  Do not soak your incision for 3 weeks after surgery.  If the incision gets wet, pat dry and do not scrub the incision.  Pain control:  You have been given a prescription to be taken as directed for post-operative pain control.  In addition, elevate the operative extremity above the heart at all times to prevent swelling and throbbing pain.  Take over-the-counter Colace, 100mg  by mouth twice a day while taking narcotic pain medications to help prevent constipation.  Follow up appointments: 1) 14 days for suture removal and wound check. 2) Dr. as scheduled.   -------------------------------------------------------------------------------------------------------------  After Surgery Pain Control:  After your surgery, post-surgical discomfort or pain is likely. This discomfort can last several days to a few weeks. At certain times of the day your discomfort may be more intense.  Did you receive a nerve block?  A nerve block can provide pain relief for one hour to two days after your surgery. As long as the nerve block is working, you will experience little or no sensation in the area the  surgeon operated on.  As the nerve block wears off, you will begin to experience pain or discomfort. It is very important that you begin taking your prescribed pain medication before the nerve block fully wears off. Treating your pain at the first sign of the block wearing off will ensure your pain is better controlled and more tolerable when full-sensation returns. Do not wait until the pain is intolerable, as the medicine will be less effective. It is better to treat pain in advance than to try and catch up.  General Anesthesia:  If you did not receive a nerve block during your surgery, you will need to start taking your pain medication shortly after your surgery and should continue to do so as prescribed by your surgeon.  Pain Medication:  Most commonly we prescribe Vicodin and Percocet for post-operative pain. Both of these medications contain a combination of acetaminophen (Tylenol) and a narcotic to help control pain.   It takes between 30 and 45 minutes before pain medication starts to work. It is important to take your medication before your pain level gets too intense.   Nausea is a common side effect of many pain medications. You will want to eat something  before taking your pain medicine to help prevent nausea.   If you are taking a prescription pain medication that contains acetaminophen, we recommend that you do not take additional over the counter acetaminophen (Tylenol).  Other pain relieving options:   Using a cold pack to ice the affected area a few times a day (15 to 20 minutes at a time) can help to relieve pain, reduce swelling and bruising.   Elevation of the affected area can also help to reduce pain and swelling.

## 2020-11-24 NOTE — Anesthesia Postprocedure Evaluation (Signed)
Anesthesia Post Note  Patient: Marcus Austin  Procedure(s) Performed: RIGHT ACROMIOCLAVICULAR JOINT RECONSTRUCTION (Right Shoulder)     Patient location during evaluation: PACU Anesthesia Type: General Level of consciousness: awake and alert Pain management: pain level controlled Vital Signs Assessment: post-procedure vital signs reviewed and stable Respiratory status: spontaneous breathing, nonlabored ventilation and respiratory function stable Cardiovascular status: blood pressure returned to baseline and stable Postop Assessment: no apparent nausea or vomiting Anesthetic complications: no   No complications documented.  Last Vitals:  Vitals:   11/24/20 1401 11/24/20 1415  BP:  (!) 140/96  Pulse: 82 91  Resp: 16 18  Temp:  37.1 C  SpO2: 96% 96%    Last Pain:  Vitals:   11/24/20 1415  TempSrc:   PainSc: 0-No pain                 Lowella Curb

## 2020-11-25 ENCOUNTER — Encounter (HOSPITAL_BASED_OUTPATIENT_CLINIC_OR_DEPARTMENT_OTHER): Payer: Self-pay | Admitting: Orthopaedic Surgery

## 2020-11-25 ENCOUNTER — Telehealth: Payer: Self-pay | Admitting: Orthopaedic Surgery

## 2020-11-25 NOTE — Telephone Encounter (Signed)
Patient returning call to Dr. Roda Shutters. Please call patient at [phone].

## 2020-11-26 IMAGING — CR DG SHOULDER 2+V*R*
3 series · 3 of 3 positions shown · non-contrast
Comparison: None.

CLINICAL DATA: Shoulder pain post injury

EXAM:
RIGHT SHOULDER - 2+ VIEW

[w shoulder external right]
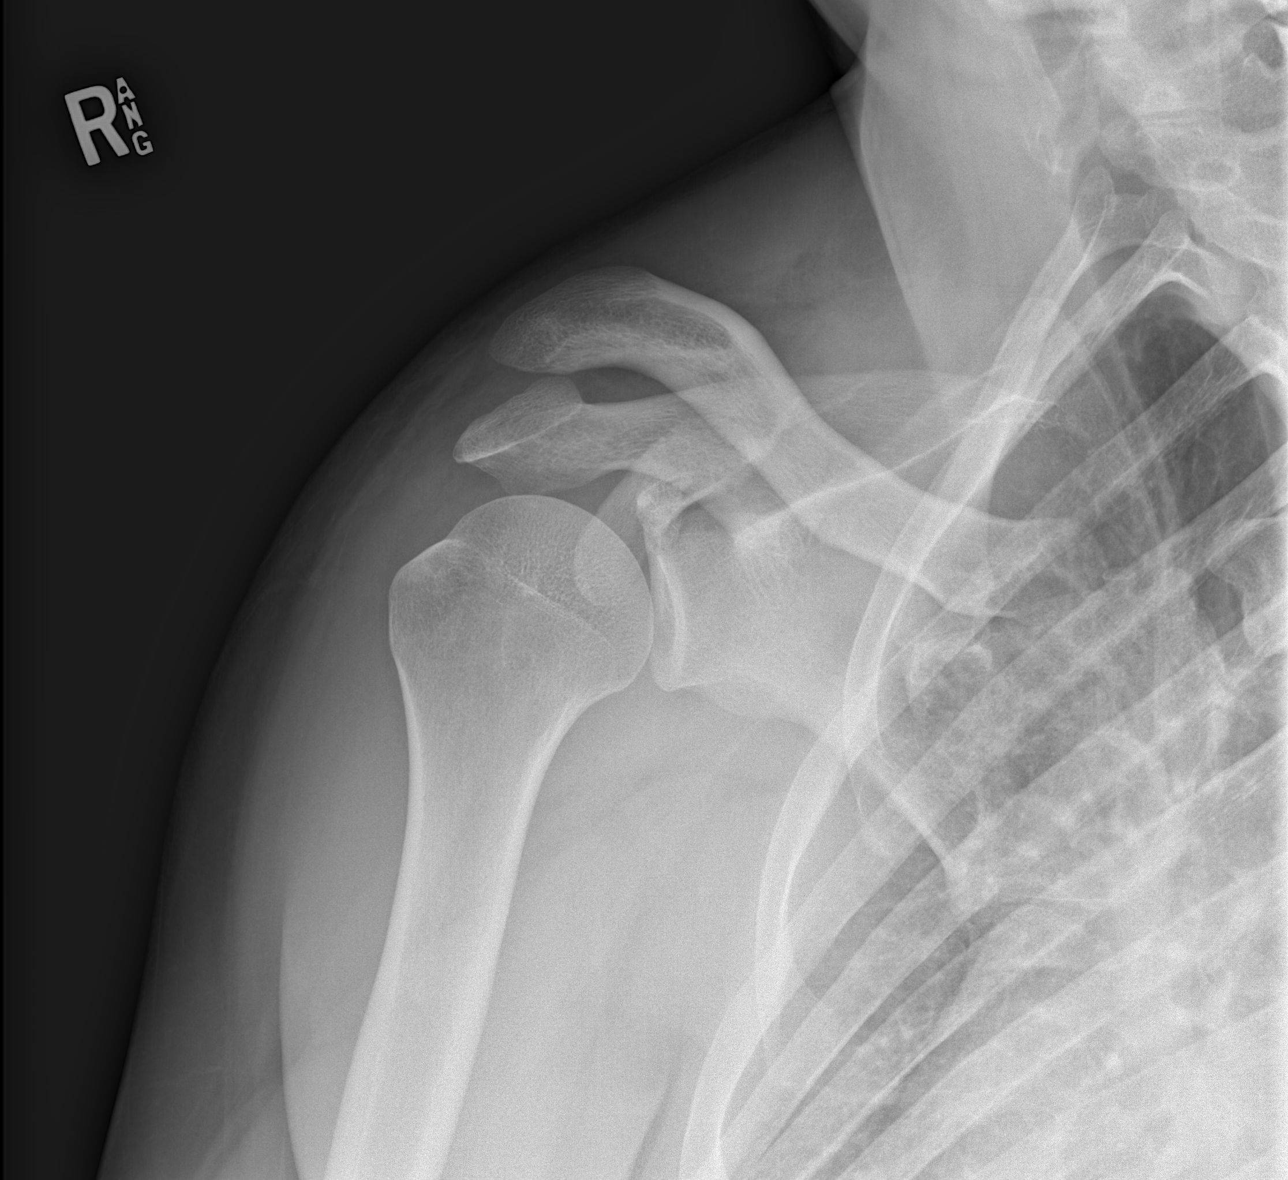

[w shoulder y-view right (1 of 2)]
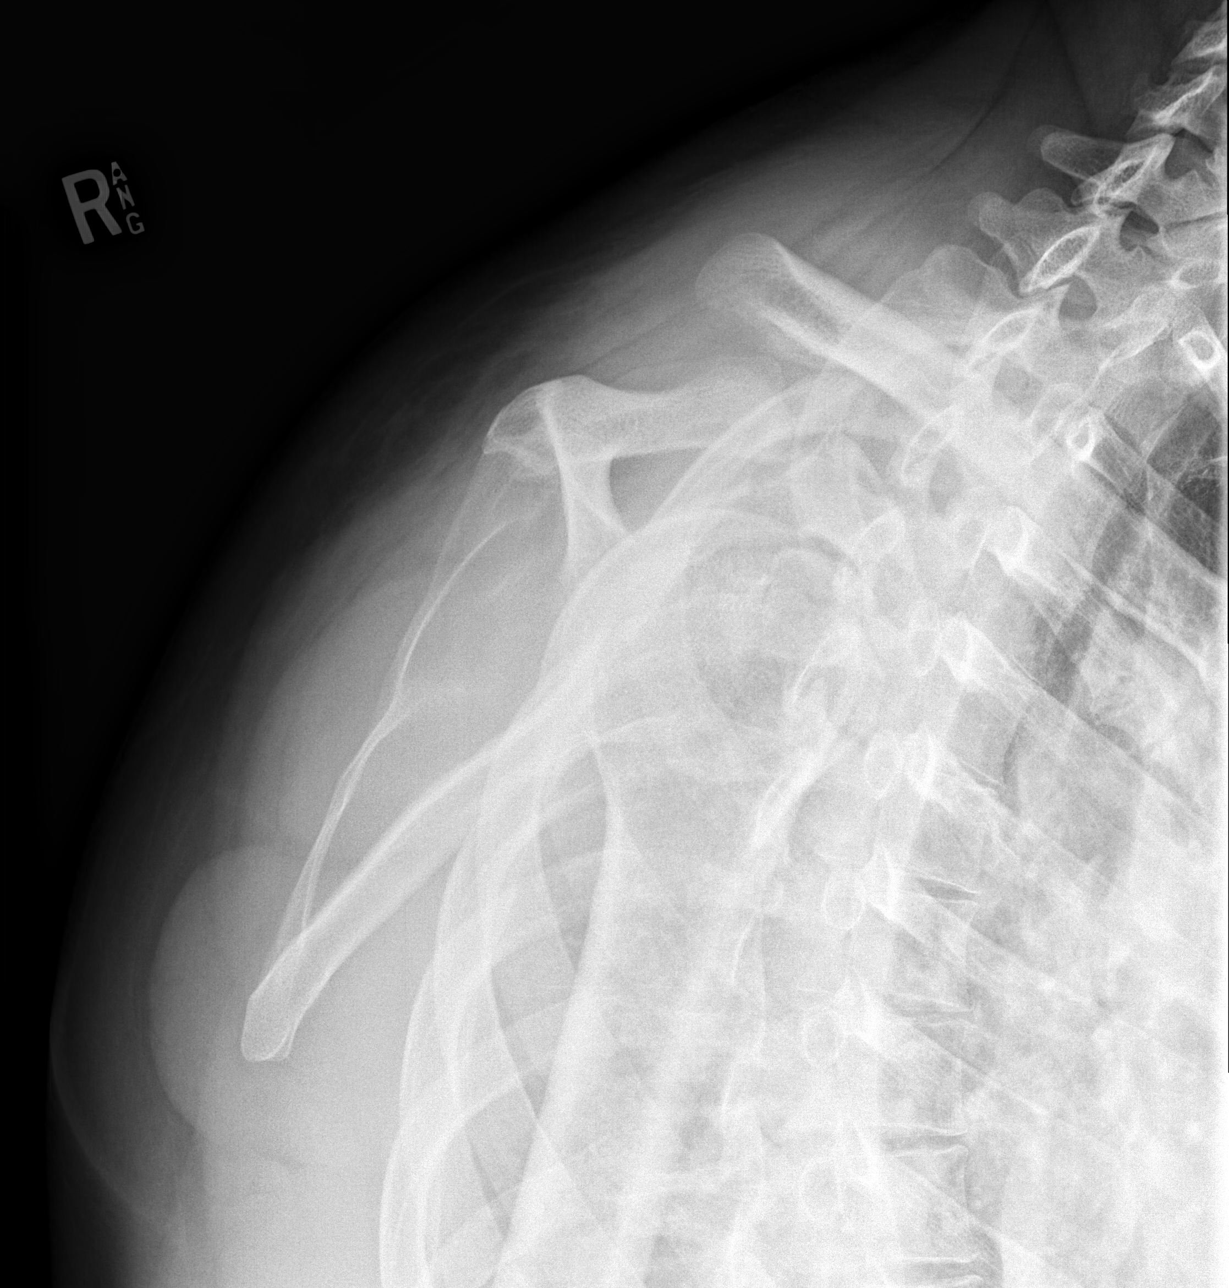

[w shoulder y-view right (2 of 2)]
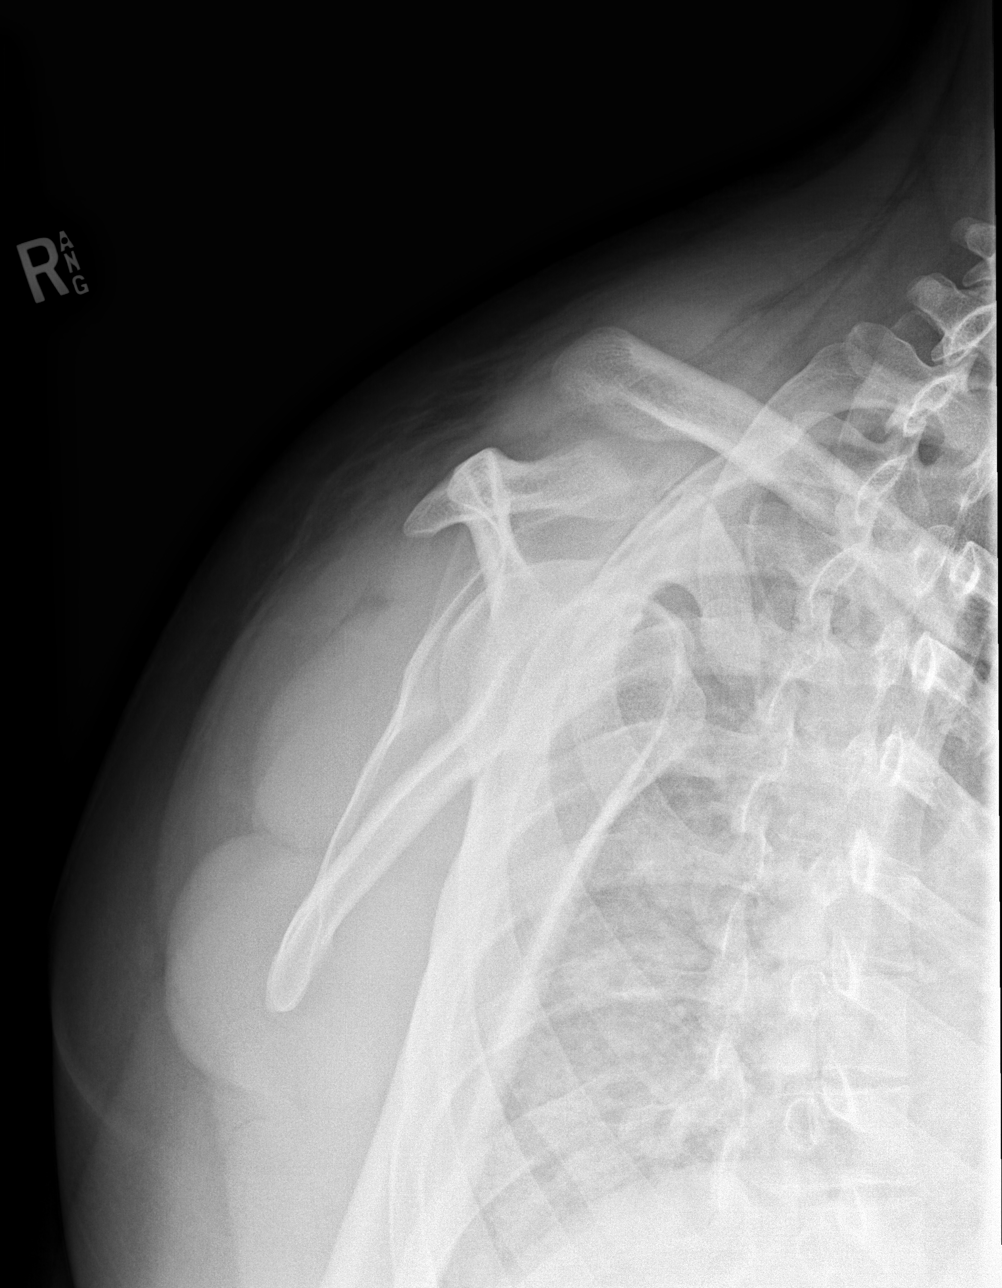

[3 of 3 positions shown; findings below may reference images not displayed]

FINDINGS: There is alignment at the glenohumeral joint. No acute fracture.
Right clavicle is located superior to the acromion.
IMPRESSION: 1. Likely type III right acromioclavicular joint injury.
2. No acute fracture.

## 2020-11-26 NOTE — Telephone Encounter (Signed)
Called and spoke to patient.

## 2020-11-27 ENCOUNTER — Encounter: Payer: Self-pay | Admitting: Family Medicine

## 2020-12-09 ENCOUNTER — Ambulatory Visit (INDEPENDENT_AMBULATORY_CARE_PROVIDER_SITE_OTHER): Payer: 59

## 2020-12-09 ENCOUNTER — Ambulatory Visit (INDEPENDENT_AMBULATORY_CARE_PROVIDER_SITE_OTHER): Payer: 59 | Admitting: Orthopaedic Surgery

## 2020-12-09 DIAGNOSIS — M25511 Pain in right shoulder: Secondary | ICD-10-CM | POA: Diagnosis not present

## 2020-12-09 DIAGNOSIS — S43101A Unspecified dislocation of right acromioclavicular joint, initial encounter: Secondary | ICD-10-CM

## 2020-12-09 NOTE — Progress Notes (Signed)
   Post-Op Visit Note   Patient: Generoso Cropper           Date of Birth: 1991-07-28           MRN: [id number] Visit Date: 12/09/2020 PCP: Jeoffrey Massed, MD   Assessment & Plan:  Chief Complaint:  Chief Complaint  Patient presents with  . Right Shoulder - Routine Post Op   Visit Diagnoses:  1. AC separation, type 5, right, initial encounter   2. Acute pain of right shoulder     Plan: Patient is a very pleasant 29 year old gentleman who comes in today 2 weeks out right [facility] joint reconstruction.  He has been doing well.  He has no pain.  He has been fairly compliant wearing his sling.  Examination of his right shoulder reveals a well-healing surgical incision without complications.  No signs of infection or cellulitis.  He is neurovascular intact distally.  Today, new Steri-Strips were applied.  He will limit his lifting to no more than the weight of his arm to the level of the shoulder for the next 4 weeks.  He will continue to wear his sling but only in public.  He will follow up with [place] in 4 weeks time for repeat evaluation and the initiation of outpatient physical therapy.  Call with concerns or questions in meantime.  Follow-Up Instructions: Return in about 4 weeks (around 01/06/2021).   Orders:  Orders Placed This Encounter  Procedures  . XR Clavicle Right   No orders of the defined types were placed in this encounter.   Imaging: XR Clavicle Right  Result Date: 12/09/2020 Reveal a stable AC joint without hardware complication   PMFS History: Patient Active Problem List   Diagnosis Date Noted  . AC separation, type 5, right, initial encounter 11/24/2020  . Obesity (BMI 30.0-34.9) 08/01/2019  . Tibia fracture 06/18/2015  . Closed left tibial fracture 06/17/2015  . Lumbar pain 06/16/2014  . Spondylolisthesis of lumbar region 06/16/2014   Past Medical History:  Diagnosis Date  . Anxiety    panic attacks  . Closed left tibial fracture 06/17/2015  . Decreased libido     question of infertility 07/2019 (although wife has PCOS): referred to [facility] for eval->Dr. Retta Diones 08/2019. Question of XYY chromosomal abnormality per pt.  . Obesity, Class I, BMI 30-34.9     Family History  Problem Relation Age of Onset  . Arthritis Mother   . Arthritis Father   . Diabetes Father   . Kidney disease Father     Past Surgical History:  Procedure Laterality Date  . ACROMIO-CLAVICULAR JOINT REPAIR Right 11/24/2020   Procedure: RIGHT ACROMIOCLAVICULAR JOINT RECONSTRUCTION;  Surgeon: Tarry Kos, MD;  Location:  SURGERY CENTER;  Service: Orthopedics;  Laterality: Right;  . TIBIA IM NAIL INSERTION Left 06/18/2015   Procedure: INTRAMEDULLARY (IM) NAIL TIBIAL;  Surgeon: Teryl Lucy, MD;  Location: [facility] OR;  Service: Orthopedics;  Laterality: Left;   Social History   Occupational History  . Not on file  Tobacco Use  . Smoking status: Never Smoker  . Smokeless tobacco: Former Neurosurgeon    Types: Snuff  Vaping Use  . Vaping Use: Never used  Substance and Sexual Activity  . Alcohol use: Yes    Comment: Occasionally  . Drug use: No  . Sexual activity: Yes

## 2020-12-22 ENCOUNTER — Ambulatory Visit: Payer: 59 | Admitting: Orthopaedic Surgery

## 2020-12-28 ENCOUNTER — Ambulatory Visit: Payer: 59 | Admitting: Orthopaedic Surgery

## 2021-01-06 ENCOUNTER — Ambulatory Visit (INDEPENDENT_AMBULATORY_CARE_PROVIDER_SITE_OTHER): Payer: BC Managed Care – PPO

## 2021-01-06 ENCOUNTER — Other Ambulatory Visit: Payer: Self-pay

## 2021-01-06 ENCOUNTER — Encounter: Payer: Self-pay | Admitting: Orthopaedic Surgery

## 2021-01-06 ENCOUNTER — Ambulatory Visit (INDEPENDENT_AMBULATORY_CARE_PROVIDER_SITE_OTHER): Payer: BC Managed Care – PPO | Admitting: Orthopaedic Surgery

## 2021-01-06 DIAGNOSIS — S43101A Unspecified dislocation of right acromioclavicular joint, initial encounter: Secondary | ICD-10-CM

## 2021-01-06 MED ORDER — METHOCARBAMOL 750 MG PO TABS: 750.0000 mg | ORAL_TABLET | Freq: Two times a day (BID) | ORAL | 3 refills | Status: DC | PRN

## 2021-01-06 NOTE — Progress Notes (Signed)
   Post-Op Visit Note   Patient: Marcus Austin           Date of Birth: 04/29/1991           MRN: [id number] Visit Date: 01/06/2021 PCP: Jeoffrey Massed, MD   Assessment & Plan:  Chief Complaint:  Chief Complaint  Patient presents with  . Right Shoulder - Pain, Follow-up   Visit Diagnoses:  1. AC separation, type 5, right, initial encounter     Plan:   Marcus Austin is 6-week status post right AC separation repair.  He is doing well overall.  No complaints.  He has a little bit of burning at times around the incision.  Takes muscle relaxer on the weekends.  Surgical scar is healed.  Neurovascular intact.  Forward flexion to 100 degrees, external rotation to 45 degrees, abduction to 90 degrees.  X-rays demonstrate stable AC joint and CC interval.  At this point we will advance him to weightbearing as tolerated and begin outpatient PT for strengthening and range of motion.  Activity restrictions and limitations reviewed with the patient.  Recheck in 6 weeks with two-view x-rays of the right clavicle.  Follow-Up Instructions: Return in about 6 weeks (around 02/17/2021).   Orders:  Orders Placed This Encounter  Procedures  . XR Clavicle Right   Meds ordered this encounter  Medications  . methocarbamol (ROBAXIN) 750 MG tablet    Sig: Take 1 tablet (750 mg total) by mouth 2 (two) times daily as needed for muscle spasms.    Dispense:  20 tablet    Refill:  3    Imaging: XR Clavicle Right  Result Date: 01/06/2021 Stable AC joint status post tight rope fixation   PMFS History: Patient Active Problem List   Diagnosis Date Noted  . AC separation, type 5, right, initial encounter 11/24/2020  . Obesity (BMI 30.0-34.9) 08/01/2019  . Tibia fracture 06/18/2015  . Closed left tibial fracture 06/17/2015  . Lumbar pain 06/16/2014  . Spondylolisthesis of lumbar region 06/16/2014   Past Medical History:  Diagnosis Date  . Anxiety    panic attacks  . Closed left tibial fracture  06/17/2015  . Decreased libido    question of infertility 07/2019 (although wife has PCOS): referred to [facility] for eval->Dr. Retta Diones 08/2019. Question of XYY chromosomal abnormality per pt.  . Obesity, Class I, BMI 30-34.9     Family History  Problem Relation Age of Onset  . Arthritis Mother   . Arthritis Father   . Diabetes Father   . Kidney disease Father     Past Surgical History:  Procedure Laterality Date  . ACROMIO-CLAVICULAR JOINT REPAIR Right 11/24/2020   Procedure: RIGHT ACROMIOCLAVICULAR JOINT RECONSTRUCTION;  Surgeon: Tarry Kos, MD;  Location: [facility];  Service: Orthopedics;  Laterality: Right;  . TIBIA IM NAIL INSERTION Left 06/18/2015   Procedure: INTRAMEDULLARY (IM) NAIL TIBIAL;  Surgeon: Teryl Lucy, MD;  Location: [facility] OR;  Service: Orthopedics;  Laterality: Left;   Social History   Occupational History  . Not on file  Tobacco Use  . Smoking status: Never Smoker  . Smokeless tobacco: Former Neurosurgeon    Types: Snuff  Vaping Use  . Vaping Use: Never used  Substance and Sexual Activity  . Alcohol use: Yes    Comment: Occasionally  . Drug use: No  . Sexual activity: Yes

## 2021-02-17 ENCOUNTER — Ambulatory Visit (INDEPENDENT_AMBULATORY_CARE_PROVIDER_SITE_OTHER): Payer: BC Managed Care – PPO | Admitting: Orthopaedic Surgery

## 2021-02-17 ENCOUNTER — Encounter: Payer: Self-pay | Admitting: Orthopaedic Surgery

## 2021-02-17 ENCOUNTER — Ambulatory Visit (INDEPENDENT_AMBULATORY_CARE_PROVIDER_SITE_OTHER): Payer: BC Managed Care – PPO

## 2021-02-17 DIAGNOSIS — S43101A Unspecified dislocation of right acromioclavicular joint, initial encounter: Secondary | ICD-10-CM

## 2021-02-17 NOTE — Progress Notes (Signed)
   Post-Op Visit Note   Patient: Marcus Austin           Date of Birth: 06-18-1991           MRN: [id number] Visit Date: 02/17/2021 PCP: Jeoffrey Massed, MD   Assessment & Plan:  Chief Complaint:  Chief Complaint  Patient presents with  . Right Shoulder - Pain   Visit Diagnoses:  1. AC separation, type 5, right, initial encounter     Plan: Patient is a pleasant 30 year old gentleman who comes in today 12 weeks out right [facility] joint reconstruction.  He has been doing very well.  He has regained near full range of motion and strength and has 3 more weeks of physical therapy before being discharged.  He is not taking anything for pain.  He does note a popping sensation when he is doing internal rotation and external rotation band exercises but this is not painful.  He has returned to work without complaints.  Examination of his right shoulder reveals near full active range of motion and strength.  He is neurovascular intact distally.  At this point, he will finish out his physical therapy.  He will continue to advance with activity as tolerated.  Follow-up with [place] as needed.  Follow-Up Instructions: Return if symptoms worsen or fail to improve.   Orders:  Orders Placed This Encounter  Procedures  . XR Clavicle Right   No orders of the defined types were placed in this encounter.   Imaging: XR Clavicle Right  Result Date: 02/17/2021 X-rays show slight interval change of the tight rope.   PMFS History: Patient Active Problem List   Diagnosis Date Noted  . AC separation, type 5, right, initial encounter 11/24/2020  . Obesity (BMI 30.0-34.9) 08/01/2019  . Tibia fracture 06/18/2015  . Closed left tibial fracture 06/17/2015  . Lumbar pain 06/16/2014  . Spondylolisthesis of lumbar region 06/16/2014   Past Medical History:  Diagnosis Date  . Anxiety    panic attacks  . Closed left tibial fracture 06/17/2015  . Decreased libido    question of infertility 07/2019 (although wife has  PCOS): referred to [facility] for eval->Dr. Retta Diones 08/2019. Question of XYY chromosomal abnormality per pt.  . Obesity, Class I, BMI 30-34.9     Family History  Problem Relation Age of Onset  . Arthritis Mother   . Arthritis Father   . Diabetes Father   . Kidney disease Father     Past Surgical History:  Procedure Laterality Date  . ACROMIO-CLAVICULAR JOINT REPAIR Right 11/24/2020   Procedure: RIGHT ACROMIOCLAVICULAR JOINT RECONSTRUCTION;  Surgeon: Tarry Kos, MD;  Location:  SURGERY CENTER;  Service: Orthopedics;  Laterality: Right;  . TIBIA IM NAIL INSERTION Left 06/18/2015   Procedure: INTRAMEDULLARY (IM) NAIL TIBIAL;  Surgeon: Teryl Lucy, MD;  Location: [facility] OR;  Service: Orthopedics;  Laterality: Left;   Social History   Occupational History  . Not on file  Tobacco Use  . Smoking status: Never Smoker  . Smokeless tobacco: Former Neurosurgeon    Types: Snuff  Vaping Use  . Vaping Use: Never used  Substance and Sexual Activity  . Alcohol use: Yes    Comment: Occasionally  . Drug use: No  . Sexual activity: Yes

## 2021-08-22 ENCOUNTER — Ambulatory Visit (INDEPENDENT_AMBULATORY_CARE_PROVIDER_SITE_OTHER): Payer: BC Managed Care – PPO | Admitting: Family Medicine

## 2021-08-22 ENCOUNTER — Other Ambulatory Visit: Payer: Self-pay

## 2021-08-22 ENCOUNTER — Encounter: Payer: Self-pay | Admitting: Family Medicine

## 2021-08-22 VITALS — BP 122/71 | HR 75 | Temp 97.8°F | Resp 16 | Ht 74.5 in | Wt 270.4 lb

## 2021-08-22 DIAGNOSIS — Z1159 Encounter for screening for other viral diseases: Secondary | ICD-10-CM | POA: Diagnosis not present

## 2021-08-22 DIAGNOSIS — F5104 Psychophysiologic insomnia: Secondary | ICD-10-CM | POA: Diagnosis not present

## 2021-08-22 DIAGNOSIS — Z0000 Encounter for general adult medical examination without abnormal findings: Secondary | ICD-10-CM

## 2021-08-22 DIAGNOSIS — Z23 Encounter for immunization: Secondary | ICD-10-CM

## 2021-08-22 DIAGNOSIS — Z114 Encounter for screening for human immunodeficiency virus [HIV]: Secondary | ICD-10-CM

## 2021-08-22 DIAGNOSIS — R5383 Other fatigue: Secondary | ICD-10-CM

## 2021-08-22 DIAGNOSIS — G4719 Other hypersomnia: Secondary | ICD-10-CM

## 2021-08-22 LAB — COMPREHENSIVE METABOLIC PANEL
ALT: 27 U/L (ref 0–53)
AST: 16 U/L (ref 0–37)
Albumin: 4.4 g/dL (ref 3.5–5.2)
Alkaline Phosphatase: 75 U/L (ref 39–117)
BUN: 10 mg/dL (ref 6–23)
CO2: 25 mEq/L (ref 19–32)
Calcium: 9.5 mg/dL (ref 8.4–10.5)
Chloride: 102 mEq/L (ref 96–112)
Creatinine, Ser: 0.89 mg/dL (ref 0.40–1.50)
GFR: 113.73 mL/min (ref >60.00)
Glucose, Bld: 75 mg/dL (ref 70–99)
Potassium: 3.7 mEq/L (ref 3.5–5.1)
Sodium: 138 mEq/L (ref 135–145)
Total Bilirubin: 0.7 mg/dL (ref 0.2–1.2)
Total Protein: 7.8 g/dL (ref 6.0–8.3)

## 2021-08-22 LAB — CBC WITH DIFFERENTIAL/PLATELET
Basophils Absolute: 0 10*3/uL (ref 0.0–0.1)
Basophils Relative: 0.4 % (ref 0.0–3.0)
Eosinophils Absolute: 0.5 10*3/uL (ref 0.0–0.7)
Eosinophils Relative: 5.9 % — ABNORMAL HIGH (ref 0.0–5.0)
HCT: 45.4 % (ref 39.0–52.0)
Hemoglobin: 15.3 g/dL (ref 13.0–17.0)
Lymphocytes Relative: 29.2 % (ref 12.0–46.0)
Lymphs Abs: 2.6 10*3/uL (ref 0.7–4.0)
MCHC: 33.7 g/dL (ref 30.0–36.0)
MCV: 83.5 fl (ref 78.0–100.0)
Monocytes Absolute: 0.5 10*3/uL (ref 0.1–1.0)
Monocytes Relative: 5.8 % (ref 3.0–12.0)
Neutro Abs: 5.2 10*3/uL (ref 1.4–7.7)
Neutrophils Relative %: 58.7 % (ref 43.0–77.0)
Platelets: 225 10*3/uL (ref 150.0–400.0)
RBC: 5.44 Mil/uL (ref 4.22–5.81)
RDW: 13.7 % (ref 11.5–15.5)
WBC: 8.8 10*3/uL (ref 4.0–10.5)

## 2021-08-22 LAB — LDL CHOLESTEROL, DIRECT: Direct LDL: 93 mg/dL

## 2021-08-22 LAB — LIPID PANEL
Cholesterol: 157 mg/dL (ref 0–200)
HDL: 39.1 mg/dL (ref >39.00)
NonHDL: 117.64
Total CHOL/HDL Ratio: 4
Triglycerides: 204 mg/dL — ABNORMAL HIGH (ref 0.0–149.0)
VLDL: 40.8 mg/dL — ABNORMAL HIGH (ref 0.0–40.0)

## 2021-08-22 LAB — TSH: TSH: 0.92 u[IU]/mL (ref 0.35–5.50)

## 2021-08-22 MED ORDER — MUPIROCIN 2 % EX OINT: 1.0000 "application " | TOPICAL_OINTMENT | Freq: Three times a day (TID) | CUTANEOUS | 0 refills | Status: DC

## 2021-08-22 MED ORDER — TRAZODONE HCL 50 MG PO TABS: ORAL_TABLET | ORAL | 1 refills | Status: DC

## 2021-08-22 NOTE — Patient Instructions (Signed)
Health Maintenance, Male Adopting a healthy lifestyle and getting preventive care are important in promoting health and wellness. Ask your health care provider about: The right schedule for you to have regular tests and exams. Things you can do on your own to prevent diseases and keep yourself healthy. What should I know about diet, weight, and exercise? Eat a healthy diet  Eat a diet that includes plenty of vegetables, fruits, low-fat dairy products, and lean protein. Do not eat a lot of foods that are high in solid fats, added sugars, or sodium.  Maintain a healthy weight Body mass index (BMI) is a measurement that can be used to identify possible weight problems. It estimates body fat based on height and weight. Your health care provider can help determine your BMI and help you achieve or maintain ahealthy weight. Get regular exercise Get regular exercise. This is one of the most important things you can do for your health. Most adults should: Exercise for at least 150 minutes each week. The exercise should increase your heart rate and make you sweat (moderate-intensity exercise). Do strengthening exercises at least twice a week. This is in addition to the moderate-intensity exercise. Spend less time sitting. Even light physical activity can be beneficial. Watch cholesterol and blood lipids Have your blood tested for lipids and cholesterol at 32 years of age, then havethis test every 5 years. You may need to have your cholesterol levels checked more often if: Your lipid or cholesterol levels are high. You are older than 32 years of age. You are at high risk for heart disease. What should I know about cancer screening? Many types of cancers can be detected early and may often be prevented. Depending on your health history and family history, you may need to have cancer screening at various ages. This may include screening for: Colorectal cancer. Prostate cancer. Skin cancer. Lung  cancer. What should I know about heart disease, diabetes, and high blood pressure? Blood pressure and heart disease High blood pressure causes heart disease and increases the risk of stroke. This is more likely to develop in people who have high blood pressure readings, are of African descent, or are overweight. Talk with your health care provider about your target blood pressure readings. Have your blood pressure checked: Every 3-5 years if you are 18-39 years of age. Every year if you are 40 years old or older. If you are between the ages of 65 and 75 and are a current or former smoker, ask your health care provider if you should have a one-time screening for abdominal aortic aneurysm (AAA). Diabetes Have regular diabetes screenings. This checks your fasting blood sugar level. Have the screening done: Once every three years after age 45 if you are at a normal weight and have a low risk for diabetes. More often and at a younger age if you are overweight or have a high risk for diabetes. What should I know about preventing infection? Hepatitis B If you have a higher risk for hepatitis B, you should be screened for this virus. Talk with your health care provider to find out if you are at risk forhepatitis B infection. Hepatitis C Blood testing is recommended for: Everyone born from 1945 through 1965. Anyone with known risk factors for hepatitis C. Sexually transmitted infections (STIs) You should be screened each year for STIs, including gonorrhea and chlamydia, if: You are sexually active and are younger than 32 years of age. You are older than 32 years of age   and your health care provider tells you that you are at risk for this type of infection. Your sexual activity has changed since you were last screened, and you are at increased risk for chlamydia or gonorrhea. Ask your health care provider if you are at risk. Ask your health care provider about whether you are at high risk for HIV.  Your health care provider may recommend a prescription medicine to help prevent HIV infection. If you choose to take medicine to prevent HIV, you should first get tested for HIV. You should then be tested every 3 months for as long as you are taking the medicine. Follow these instructions at home: Lifestyle Do not use any products that contain nicotine or tobacco, such as cigarettes, e-cigarettes, and chewing tobacco. If you need help quitting, ask your health care provider. Do not use street drugs. Do not share needles. Ask your health care provider for help if you need support or information about quitting drugs. Alcohol use Do not drink alcohol if your health care provider tells you not to drink. If you drink alcohol: Limit how much you have to 0-2 drinks a day. Be aware of how much alcohol is in your drink. In the [place] one drink equals one 12 oz bottle of beer (355 mL), one 5 oz glass of wine (148 mL), or one 1 oz glass of hard liquor (44 mL). General instructions Schedule regular health, dental, and eye exams. Stay current with your vaccines. Tell your health care provider if: You often feel depressed. You have ever been abused or do not feel safe at home. Summary Adopting a healthy lifestyle and getting preventive care are important in promoting health and wellness. Follow your health care provider's instructions about healthy diet, exercising, and getting tested or screened for diseases. Follow your health care provider's instructions on monitoring your cholesterol and blood pressure. This information is not intended to replace advice given to you by your health care provider. Make sure you discuss any questions you have with your healthcare provider. Document Revised: 12/04/2018 Document Reviewed: 12/04/2018 Elsevier Patient Education  2022 Elsevier Inc.  

## 2021-08-22 NOTE — Progress Notes (Signed)
Office Note 08/22/2021  CC:  Chief Complaint  Patient presents with   Annual Exam    HPI:  Marcus Austin is a 30 y.o. male who is here for annual health maintenance exam. I last saw her August 2020 for establish care/cpe. A/P as of that visit: "1) Health maintenance exam: Reviewed age and gender appropriate health maintenance issues (prudent diet, regular exercise, health risks of tobacco and excessive alcohol, use of seatbelts, fire alarms in home, use of sunscreen).  Also reviewed age and gender appropriate health screening as well as vaccine recommendations. Vaccines: none today.  Recommended flu vaccine around Oct. Labs: future fasting [facility] labs.   2) Decreased libido, question of infertility issues. Will refer to urology for eval of this."  INTERIM HX: Describes anxiety-induced sleep problems lately. Benadryl not helpful.  Takes 1-1.5 hrs to get to sleep.  Snores.  No witnessed apnea.   He does have some excessive daytime sleepiness mainly in 1st half of the day. Minimal caffeine intake. No panic attacks, no depressed mood.  Some alcohol on weekends-->6 beers per day on these days.   Has smoked on and off for years, dips tobacco chronically.   Got a bite on back of R knee 3 d/a, red and with a scab.   Labs all normal when I saw him last 07/2019.    Past Medical History:  Diagnosis Date   Anxiety    panic attacks   Closed left tibial fracture 06/17/2015   Decreased libido    question of infertility 07/2019 (although wife has PCOS): referred to [facility] for eval->Dr. Retta Diones 08/2019. Question of XYY chromosomal abnormality per pt.   GERD (gastroesophageal reflux disease)    Obesity, Class I, BMI 30-34.9     Past Surgical History:  Procedure Laterality Date   ACROMIO-CLAVICULAR JOINT REPAIR Right 11/24/2020   Procedure: RIGHT ACROMIOCLAVICULAR JOINT RECONSTRUCTION;  Surgeon: Tarry Kos, MD;  Location: [facility];  Service: Orthopedics;  Laterality:  Right;   TIBIA IM NAIL INSERTION Left 06/18/2015   Procedure: INTRAMEDULLARY (IM) NAIL TIBIAL;  Surgeon: Teryl Lucy, MD;  Location: [facility];  Service: Orthopedics;  Laterality: Left;    Family History  Problem Relation Age of Onset   Arthritis Mother    Hypothyroidism Mother    Osteoarthritis Mother    Arthritis Father    Diabetes type II Father    Hypertension Father    Congestive Heart Failure Father    Benign prostatic hyperplasia Father    Anxiety disorder Father    Obesity Father    Hyperlipidemia Father    Cholecystitis Brother    Dementia Maternal Grandmother     Social History   Socioeconomic History   Marital status: Single    Spouse name: Not on file   Number of children: Not on file   Years of education: Not on file   Highest education level: Not on file  Occupational History   Not on file  Tobacco Use   Smoking status: Never   Smokeless tobacco: Former    Types: Snuff  Vaping Use   Vaping Use: Never used  Substance and Sexual Activity   Alcohol use: Yes    Comment: Occasionally   Drug use: No   Sexual activity: Yes  Other Topics Concern   Not on file  Social History Narrative   Married, no children.   Raised in [place], [place].   Educ: some college.   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  FTD:DUKG   Alc: 6 pack/week   Social Determinants of Health   Financial Resource Strain: Not on file  Food Insecurity: Not on file  Transportation Needs: Not on file  Physical Activity: Not on file  Stress: Not on file  Social Connections: Not on file  Intimate Partner Violence: Not on file    Outpatient Medications Prior to Visit  Medication Sig Dispense Refill   Cetirizine HCl (ZYRTEC PO) Take by mouth as needed.     acetaminophen (TYLENOL) 325 MG tablet Take 650 mg by mouth every 6 (six) hours as needed. (Patient not taking: No sig reported)     ibuprofen (ADVIL) 200 MG tablet Take 400 mg by mouth every 6 (six) hours as needed. (Patient not  taking: Reported on 08/22/2021)     methocarbamol (ROBAXIN) 750 MG tablet Take 1 tablet (750 mg total) by mouth 2 (two) times daily as needed for muscle spasms. (Patient not taking: Reported on 08/22/2021) 20 tablet 3   No facility-administered medications prior to visit.    Allergies  Allergen Reactions   Sulfa Antibiotics Swelling   Latex Swelling and Rash    ROS Review of Systems  Constitutional:  Positive for fatigue. Negative for appetite change, chills and fever.  HENT:  Negative for congestion, dental problem, ear pain and sore throat.   Eyes:  Negative for discharge, redness and visual disturbance.  Respiratory:  Negative for cough, chest tightness, shortness of breath and wheezing.   Cardiovascular:  Negative for chest pain, palpitations and leg swelling.  Gastrointestinal:  Negative for abdominal pain, blood in stool, diarrhea, nausea and vomiting.  Genitourinary:  Negative for difficulty urinating, dysuria, flank pain, frequency, hematuria and urgency.  Musculoskeletal:  Negative for arthralgias, back pain, joint swelling, myalgias and neck stiffness.  Skin:  Positive for rash (R popliteal area insect bite). Negative for pallor.  Neurological:  Negative for dizziness, speech difficulty, weakness and headaches.  Hematological:  Negative for adenopathy. Does not bruise/bleed easily.  Psychiatric/Behavioral:  Negative for confusion and sleep disturbance. The patient is not nervous/anxious.    PE; Vitals with BMI 08/22/2021 11/24/2020 11/24/2020  Height 6' 2.5" - -  Weight 270 lbs 6 oz - -  BMI 34.26 - -  Systolic 122 140 -  Diastolic 71 96 -  Pulse 75 91 82   Gen: Alert, well appearing.  Patient is oriented to person, place, time, and situation. AFFECT: pleasant, lucid thought and speech. ENT: Ears: EACs clear, normal epithelium.  TMs with good light reflex and landmarks bilaterally.  Eyes: no injection, icteris, swelling, or exudate.  EOMI, PERRLA. Nose: no drainage or  turbinate edema/swelling.  No injection or focal lesion.  Mouth: lips without lesion/swelling.  Oral mucosa pink and moist.  Dentition intact and without obvious caries or gingival swelling.  Oropharynx without erythema, exudate, or swelling.  Neck: supple/nontender.  No LAD, mass, or TM.  Carotid pulses 2+ bilaterally, without bruits. CV: RRR, no m/r/g.   LUNGS: CTA bilat, nonlabored resps, good aeration in all lung fields. ABD: soft, NT, ND, BS normal.  No hepatospenomegaly or mass.  No bruits. EXT: no clubbing, cyanosis, or edema.  Musculoskeletal: no joint swelling, erythema, warmth, or tenderness.  ROM of all joints intact. Skin - R popliteal region with 3 mm round erythematous papule, 1 cm surrounding erythema.  No central clearing. No necrosis, no vesicles.  Not tender. otherwise no sores or suspicious lesions or rashes or color changes  Pertinent labs:  Lab Results  Component  Value Date   TSH 0.62 08/28/2019   Lab Results  Component Value Date   WBC 6.4 08/28/2019   HGB 16.3 08/28/2019   HCT 48.5 08/28/2019   MCV 86.3 08/28/2019   PLT 194.0 08/28/2019   Lab Results  Component Value Date   CREATININE 0.94 08/28/2019   BUN 16 08/28/2019   NA 140 08/28/2019   K 4.4 08/28/2019   CL 103 08/28/2019   CO2 29 08/28/2019   Lab Results  Component Value Date   ALT 24 08/28/2019   AST 18 08/28/2019   ALKPHOS 61 08/28/2019   BILITOT 0.7 08/28/2019   Lab Results  Component Value Date   CHOL 168 08/28/2019   Lab Results  Component Value Date   HDL 46.80 08/28/2019   Lab Results  Component Value Date   LDLCALC 102 (H) 08/28/2019   Lab Results  Component Value Date   TRIG 95.0 08/28/2019   Lab Results  Component Value Date   CHOLHDL 4 08/28/2019   ASSESSMENT AND PLAN:  1) Insomnia: start trazodone trial, 50-100mg  qhs prn.  Therapeutic expectations and side effect profile of medication discussed today.  Patient's questions answered.  2) Fatigue, excessive daytime  somnolence.  He is deconditioned--just started working out again at [employer]. +23 lb wt gain in the last 2 yrs. Discussed possibility of further eval for OSA and he wants to talk it over with his wife first.  3) ADD symptoms: not discussed today but we'll address this at f/u visit in 3-4 wks.  4) R popliteal area insect bite: central area signi inflamed vs small staph furuncle-->bactroban ointment rx'd.  5) Health maintenance exam: Reviewed age and gender appropriate health maintenance issues (prudent diet, regular exercise, health risks of tobacco and excessive alcohol, use of seatbelts, fire alarms in home, use of sunscreen).  Also reviewed age and gender appropriate health screening as well as vaccine recommendations. Vaccines: Tdap->given today.  Flu->given today. Labs: [facility] labs + Hiv and Hep C screening.  An After Visit Summary was printed and given to the patient.  FOLLOW UP:  Return in about 1 year (around 08/22/2022) for f/u insomnia, discuss ADD.  Signed:  Santiago Bumpers, MD           08/22/2021

## 2021-08-23 LAB — HEPATITIS C ANTIBODY
Hepatitis C Ab: NONREACTIVE
SIGNAL TO CUT-OFF: 0.03 (ref <1.00)

## 2021-08-23 LAB — HIV ANTIBODY (ROUTINE TESTING W REFLEX): HIV 1&2 Ab, 4th Generation: NONREACTIVE

## 2021-08-30 ENCOUNTER — Encounter (HOSPITAL_BASED_OUTPATIENT_CLINIC_OR_DEPARTMENT_OTHER): Payer: Self-pay | Admitting: *Deleted

## 2021-08-30 ENCOUNTER — Other Ambulatory Visit: Payer: Self-pay

## 2021-08-30 DIAGNOSIS — Z87891 Personal history of nicotine dependence: Secondary | ICD-10-CM | POA: Diagnosis not present

## 2021-08-30 DIAGNOSIS — Z91040 Latex allergy status: Secondary | ICD-10-CM | POA: Insufficient documentation

## 2021-08-30 DIAGNOSIS — M542 Cervicalgia: Secondary | ICD-10-CM | POA: Diagnosis not present

## 2021-08-30 DIAGNOSIS — R42 Dizziness and giddiness: Secondary | ICD-10-CM | POA: Insufficient documentation

## 2021-08-30 DIAGNOSIS — R202 Paresthesia of skin: Secondary | ICD-10-CM | POA: Diagnosis not present

## 2021-08-30 NOTE — ED Triage Notes (Signed)
He googled neck pain and he diagnosed himself with bulging disc. Pain x 4 days. He has had episodes of numbness in his face and arm on and off since Friday relieved by repositioning his head and neck.

## 2021-08-31 ENCOUNTER — Emergency Department (HOSPITAL_BASED_OUTPATIENT_CLINIC_OR_DEPARTMENT_OTHER): Payer: BC Managed Care – PPO

## 2021-08-31 ENCOUNTER — Encounter (HOSPITAL_BASED_OUTPATIENT_CLINIC_OR_DEPARTMENT_OTHER): Payer: Self-pay | Admitting: Radiology

## 2021-08-31 ENCOUNTER — Emergency Department (HOSPITAL_BASED_OUTPATIENT_CLINIC_OR_DEPARTMENT_OTHER)
Admission: EM | Admit: 2021-08-31 | Discharge: 2021-08-31 | Disposition: A | Discharge status: Home / Self Care | Payer: BC Managed Care – PPO | Attending: Emergency Medicine | Admitting: Emergency Medicine

## 2021-08-31 DIAGNOSIS — M542 Cervicalgia: Secondary | ICD-10-CM

## 2021-08-31 MED ORDER — METHOCARBAMOL 500 MG PO TABS: 500.0000 mg | ORAL_TABLET | Freq: Three times a day (TID) | ORAL | 0 refills | Status: DC | PRN

## 2021-08-31 MED ORDER — IOHEXOL 350 MG/ML SOLN
100.0000 mL | Freq: Once | INTRAVENOUS | Status: AC | PRN
  Administered 2021-08-31: 100 mL via INTRAVENOUS

## 2021-08-31 NOTE — ED Provider Notes (Signed)
[facility] EMERGENCY DEPARTMENT Provider Note   CSN: [id number] Arrival date & time: 08/30/21  2056     History Chief Complaint  Patient presents with   Neck Pain    Marcus Austin is a 30 y.o. male.  30 year old male who presents emerged part today with neck pain.  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  Also had some vertigo at that time as well.  All that has resolved.  He will have some intermittent episodes of lightheadedness but no other associated symptoms.  He has a chiropractor who cracked his neck and seem to make it worse.  Thinks he might have a pinched nerve and wants to get checked out.   Neck Pain Pain location:  Generalized neck Quality:  Aching and shooting Pain radiates to:  Does not radiate Pain severity:  Mild Onset quality:  Gradual Duration:  3 days Timing:  Intermittent Chronicity:  New Context: not fall, not jumping from heights, not lifting a heavy object, not MCA, not MVC, not pedestrian accident and not recent injury   Relieved by:  None tried Worsened by:  Nothing Ineffective treatments:  None tried     Past Medical History:  Diagnosis Date   Anxiety    panic attacks   Closed left tibial fracture 06/17/2015   Decreased libido    question of infertility 07/2019 (although wife has PCOS): referred to [facility] eval->Dr. Retta Diones 08/2019. Question of XYY chromosomal abnormality per pt.   GERD (gastroesophageal reflux disease)    Obesity, Class I, BMI 30-34.9     Patient Active Problem List   Diagnosis Date Noted   AC separation, type 5, right, initial encounter 11/24/2020   Obesity (BMI 30.0-34.9) 08/01/2019   Tibia fracture 06/18/2015   Closed left tibial fracture 06/17/2015   Lumbar pain 06/16/2014   Spondylolisthesis of lumbar region 06/16/2014    Past Surgical History:  Procedure Laterality Date   ACROMIO-CLAVICULAR JOINT REPAIR Right  11/24/2020   Procedure: RIGHT ACROMIOCLAVICULAR JOINT RECONSTRUCTION;  Surgeon: Tarry Kos, MD;  Location: [facility];  Service: Orthopedics;  Laterality: Right;   TIBIA IM NAIL INSERTION Left 06/18/2015   Procedure: INTRAMEDULLARY (IM) NAIL TIBIAL;  Surgeon: Teryl Lucy, MD;  Location: [facility] OR;  Service: Orthopedics;  Laterality: Left;       Family History  Problem Relation Age of Onset   Arthritis Mother    Hypothyroidism Mother    Osteoarthritis Mother    Arthritis Father    Diabetes type II Father    Hypertension Father    Congestive Heart Failure Father    Benign prostatic hyperplasia Father    Anxiety disorder Father    Obesity Father    Hyperlipidemia Father    Cholecystitis Brother    Dementia Maternal Grandmother     Social History   Tobacco Use   Smoking status: Never   Smokeless tobacco: Former    Types: Snuff  Vaping Use   Vaping Use: Never used  Substance Use Topics   Alcohol use: Yes    Comment: Occasionally   Drug use: No    Home Medications Prior to Admission medications   Medication Sig Start Date End Date Taking? Authorizing Provider  Cetirizine HCl (ZYRTEC PO) Take by mouth as needed.   Yes [provider]  ibuprofen (ADVIL) 200 MG tablet Take 400 mg by mouth every 6 (six) hours as needed.  Yes [provider]  methocarbamol (ROBAXIN) 500 MG tablet Take 1 tablet (500 mg total) by mouth every 8 (eight) hours as needed for muscle spasms. 08/31/21  Yes Haylin Camilli, Barbara Cower, MD  pantoprazole (PROTONIX) 40 MG tablet Take 40 mg by mouth daily.   Yes [provider]  traZODone (DESYREL) 50 MG tablet 1-2 tabs po qhs prn insomnia 08/22/21  Yes McGowen, Maryjean Morn, MD  acetaminophen (TYLENOL) 325 MG tablet Take 650 mg by mouth every 6 (six) hours as needed. Patient not taking: No sig reported    [provider]    Allergies    Sulfa antibiotics and Latex  Review of Systems   Review of Systems  Musculoskeletal:   Positive for neck pain.  All other systems reviewed and are negative.  Physical Exam Updated Vital Signs BP 131/80 (BP Location: Right Arm)   Pulse 66   Temp 98.4 F (36.9 C) (Oral)   Resp 17   Ht 6' 2.5" (1.892 m)   Wt 122.7 kg   SpO2 98%   BMI 34.27 kg/m   Physical Exam Vitals and nursing note reviewed.  Constitutional:      Appearance: He is well-developed.  HENT:     Head: Normocephalic and atraumatic.     Nose: Nose normal. No congestion or rhinorrhea.     Mouth/Throat:     Mouth: Mucous membranes are moist.  Eyes:     Pupils: Pupils are equal, round, and reactive to light.  Cardiovascular:     Rate and Rhythm: Normal rate.  Pulmonary:     Effort: Pulmonary effort is normal. No respiratory distress.  Abdominal:     General: Abdomen is flat. There is no distension.  Musculoskeletal:        General: Normal range of motion.     Cervical back: Normal range of motion.  Skin:    General: Skin is warm.  Neurological:     General: No focal deficit present.     Mental Status: He is alert.     Comments: Face is symmetrically intact, shoulder shrug is symmetrical, grip strength is symmetrical, bicep tricep strength is symmetrical, leg lift is symmetrical, has symmetric and intact sensation to light touch bilaterally in lower and upper extremities.    ED Results / Procedures / Treatments   Labs (all labs ordered are listed, but only abnormal results are displayed) Labs Reviewed - No data to display  EKG None  Radiology CT Angio Head W or Wo Contrast  Result Date: 08/31/2021 CLINICAL DATA:  Initial evaluation for neuro deficit, stroke suspected. EXAM: CT ANGIOGRAPHY HEAD AND NECK TECHNIQUE: Multidetector CT imaging of the head and neck was performed using the standard protocol during bolus administration of intravenous contrast. Multiplanar CT image reconstructions and MIPs were obtained to evaluate the vascular anatomy. Carotid stenosis measurements (when applicable)  are obtained utilizing NASCET criteria, using the distal internal carotid diameter as the denominator. CONTRAST:  OMNIPAQUE IOHEXOL 350 MG/ML SOLN COMPARISON:  None available. FINDINGS: CT HEAD FINDINGS Brain: Cerebral volume within normal limits for patient age. No evidence for acute intracranial hemorrhage. No findings to suggest acute large vessel territory infarct. No mass lesion, midline shift, or mass effect. Ventricles are normal in size without evidence for hydrocephalus. No extra-axial fluid collection identified. Vascular: No hyperdense vessel identified. Skull: Scalp soft tissues demonstrate no acute abnormality. Calvarium intact. Sinuses/Orbits: Globes and orbital soft tissues within normal limits. Visualized paranasal sinuses are clear. No mastoid effusion. CTA NECK FINDINGS Aortic  arch: Visualized aortic arch normal in caliber with normal branch pattern. No stenosis or other abnormality about the origin of the great vessels. Right carotid system: Right common and internal carotid arteries widely patent without stenosis, dissection or occlusion. Left carotid system: Left common and internal carotid arteries widely patent without stenosis, dissection or occlusion. Vertebral arteries: Both vertebral arteries arise from the subclavian arteries. No proximal subclavian artery stenosis. Both vertebral arteries widely patent without stenosis, dissection or occlusion. Skeleton: No visible acute osseous finding. No discrete or worrisome osseous lesions. Other neck: No other acute soft tissue abnormality within the neck. No mass or adenopathy. Few small thyroid nodules noted, largest of which measures 6 mm on the right. Findings of doubtful significance given size, with no follow-up imaging recommended (ref: J Am Coll Radiol. 2015 Feb;12(2): 143-50). Upper chest: Visualized upper chest demonstrates no acute finding. Review of the MIP images confirms the above findings CTA HEAD FINDINGS Anterior circulation:  Both internal carotid arteries widely patent to the termini without stenosis. A1 segments widely patent. Normal anterior communicating artery complex. Both anterior cerebral arteries widely patent to their distal aspects without stenosis. No M1 stenosis or occlusion. Normal MCA bifurcations. Distal MCA branches well perfused and symmetric. Posterior circulation: Both V4 segments patent to the vertebrobasilar junction without stenosis. Both PICA origins patent and normal. Basilar widely patent to its distal aspect without stenosis. Superior cerebellar arteries patent bilaterally. Both PCAs primarily supplied via the basilar and are well perfused to there distal aspects. Venous sinuses: Patent allowing for timing the contrast bolus. Anatomic variants: None significant.  No aneurysm. Review of the MIP images confirms the above findings IMPRESSION: 1. Normal CTA of the head and neck. No large vessel occlusion, hemodynamically significant stenosis, or other acute vascular abnormality. 2. No other acute intracranial abnormality. Electronically Signed   By: Rise MuBenjamin  McClintock M.D.   On: 08/31/2021 02:57   CT Angio Neck W and/or Wo Contrast  Result Date: 08/31/2021 CLINICAL DATA:  Initial evaluation for neuro deficit, stroke suspected. EXAM: CT ANGIOGRAPHY HEAD AND NECK TECHNIQUE: Multidetector CT imaging of the head and neck was performed using the standard protocol during bolus administration of intravenous contrast. Multiplanar CT image reconstructions and MIPs were obtained to evaluate the vascular anatomy. Carotid stenosis measurements (when applicable) are obtained utilizing NASCET criteria, using the distal internal carotid diameter as the denominator. CONTRAST:  100mL OMNIPAQUE IOHEXOL 350 MG/ML SOLN COMPARISON:  None available. FINDINGS: CT HEAD FINDINGS Brain: Cerebral volume within normal limits for patient age. No evidence for acute intracranial hemorrhage. No findings to suggest acute large vessel  territory infarct. No mass lesion, midline shift, or mass effect. Ventricles are normal in size without evidence for hydrocephalus. No extra-axial fluid collection identified. Vascular: No hyperdense vessel identified. Skull: Scalp soft tissues demonstrate no acute abnormality. Calvarium intact. Sinuses/Orbits: Globes and orbital soft tissues within normal limits. Visualized paranasal sinuses are clear. No mastoid effusion. CTA NECK FINDINGS Aortic arch: Visualized aortic arch normal in caliber with normal branch pattern. No stenosis or other abnormality about the origin of the great vessels. Right carotid system: Right common and internal carotid arteries widely patent without stenosis, dissection or occlusion. Left carotid system: Left common and internal carotid arteries widely patent without stenosis, dissection or occlusion. Vertebral arteries: Both vertebral arteries arise from the subclavian arteries. No proximal subclavian artery stenosis. Both vertebral arteries widely patent without stenosis, dissection or occlusion. Skeleton: No visible acute osseous finding. No discrete or worrisome osseous lesions. Other neck: No  other acute soft tissue abnormality within the neck. No mass or adenopathy. Few small thyroid nodules noted, largest of which measures 6 mm on the right. Findings of doubtful significance given size, with no follow-up imaging recommended (ref: J Am Coll Radiol. 2015 Feb;12(2): 143-50). Upper chest: Visualized upper chest demonstrates no acute finding. Review of the MIP images confirms the above findings CTA HEAD FINDINGS Anterior circulation: Both internal carotid arteries widely patent to the termini without stenosis. A1 segments widely patent. Normal anterior communicating artery complex. Both anterior cerebral arteries widely patent to their distal aspects without stenosis. No M1 stenosis or occlusion. Normal MCA bifurcations. Distal MCA branches well perfused and symmetric. Posterior  circulation: Both V4 segments patent to the vertebrobasilar junction without stenosis. Both PICA origins patent and normal. Basilar widely patent to its distal aspect without stenosis. Superior cerebellar arteries patent bilaterally. Both PCAs primarily supplied via the basilar and are well perfused to there distal aspects. Venous sinuses: Patent allowing for timing the contrast bolus. Anatomic variants: None significant.  No aneurysm. Review of the MIP images confirms the above findings IMPRESSION: 1. Normal CTA of the head and neck. No large vessel occlusion, hemodynamically significant stenosis, or other acute vascular abnormality. 2. No other acute intracranial abnormality. Electronically Signed   By: Rise Mu M.D.   On: 08/31/2021 02:57    Procedures Procedures   Medications Ordered in ED Medications  iohexol (OMNIPAQUE) 350 MG/ML injection 100 mL (100 mLs Intravenous Contrast Given 08/31/21 0210)    ED Course  I have reviewed the triage vital signs and the nursing notes.  Pertinent labs & imaging results that were available during my care of the patient were reviewed by me and considered in my medical decision making (see chart for details).    MDM Rules/Calculators/A&P                         CTA done to evaluate for any kind of vertebral pathology or stroke and was negative.  Also not show any bony abnormalities to suggest bulging disc or pinched nerve.  He is neurologically intact at this time.  Will refer back to his PCP for further work-up and management.  Final Clinical Impression(s) / ED Diagnoses Final diagnoses:  Neck pain    Rx / DC Orders ED Discharge Orders          Ordered    methocarbamol (ROBAXIN) 500 MG tablet  Every 8 hours PRN        08/31/21 0349             Martell Mcfadyen, Barbara Cower, MD 08/31/21 [phone]

## 2021-08-31 NOTE — ED Notes (Signed)
Pt A&OX4, ambulatory at d/c with independent steady gait, NAD 

## 2021-08-31 NOTE — ED Notes (Signed)
Patient transported to CT 

## 2021-08-31 NOTE — ED Notes (Signed)
ED Provider at bedside. 

## 2021-09-01 ENCOUNTER — Ambulatory Visit: Payer: BC Managed Care – PPO | Admitting: Family Medicine

## 2021-09-01 ENCOUNTER — Other Ambulatory Visit: Payer: Self-pay

## 2021-09-01 ENCOUNTER — Encounter: Payer: Self-pay | Admitting: Family Medicine

## 2021-09-01 VITALS — BP 125/71 | HR 72 | Temp 97.8°F | Resp 16 | Ht 74.5 in | Wt 264.4 lb

## 2021-09-01 DIAGNOSIS — F410 Panic disorder [episodic paroxysmal anxiety] without agoraphobia: Secondary | ICD-10-CM

## 2021-09-01 MED ORDER — CITALOPRAM HYDROBROMIDE 20 MG PO TABS: 20.0000 mg | ORAL_TABLET | Freq: Every day | ORAL | 0 refills | Status: DC

## 2021-09-01 NOTE — Progress Notes (Signed)
OFFICE VISIT  09/01/2021  CC:  Chief Complaint  Patient presents with   Follow-up    ED; did CT. Feels dizziness occur more when sitting to standing and when driving.     HPI:    Patient is a 30 y.o. male who presents for f/u ED visit ([facility]).  ED visit was yesterday, 08/31/21. I reviewed entire encounter data today.  He presented for a 2-3 d hx of neck pain with R arm and R leg paresthesias. CT angio neck and head were done to evaluate for any kind of vertebral pathology or stroke and was negative.  Also it did not show any bony abnormalities to suggest bulging disc or pinched nerve.  His neuro exam was normal.  He was diagnosed with musculoskeletal neck pain and rx'd methocarbamol.  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  He felt like something horrible was wrong with him.  He had been eating/drinking fine.  Next couple days felt back to normal pretty much.  Noted ongoing mild L post neck pain and tendency to feel slightly "motion sick" while driving.  He went to chiropracter and got neck adjustment and it felt better for a while but returned later in evening so he went to ED (yesterday-see above). Since going home from ED he has felt ok, just still some feeling of motion sickness while driving and mild similar feeling when he stands up.  [place].  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  Past Medical History:  Diagnosis Date   Anxiety    panic attacks   Closed left tibial fracture 06/17/2015   Decreased libido    question of infertility 07/2019 (although wife has PCOS): referred to [facility] for eval->Dr. Retta Diones 08/2019. Question of XYY chromosomal abnormality per pt.   GERD  (gastroesophageal reflux disease)    Obesity, Class I, BMI 30-34.9     Past Surgical History:  Procedure Laterality Date   ACROMIO-CLAVICULAR JOINT REPAIR Right 11/24/2020   Procedure: RIGHT ACROMIOCLAVICULAR JOINT RECONSTRUCTION;  Surgeon: Tarry Kos, MD;  Location: [facility];  Service: Orthopedics;  Laterality: Right;   TIBIA IM NAIL INSERTION Left 06/18/2015   Procedure: INTRAMEDULLARY (IM) NAIL TIBIAL;  Surgeon: Teryl Lucy, MD;  Location: [facility] OR;  Service: Orthopedics;  Laterality: Left;    Outpatient Medications Prior to Visit  Medication Sig Dispense Refill   Cetirizine HCl (ZYRTEC PO) Take by mouth as needed.     ibuprofen (ADVIL) 200 MG tablet Take 400 mg by mouth every 6 (six) hours as needed.     methocarbamol (ROBAXIN) 500 MG tablet Take 1 tablet (500 mg total) by mouth every 8 (eight) hours as needed for muscle spasms. 20 tablet 0   pantoprazole (PROTONIX) 40 MG tablet Take 40 mg by mouth daily.     acetaminophen (TYLENOL) 325 MG tablet Take 650 mg by mouth every 6 (six) hours as needed. (Patient not taking: No sig reported)     traZODone (DESYREL) 50 MG tablet 1-2 tabs po qhs prn insomnia (Patient not taking: Reported on 09/01/2021) 30 tablet 1   No facility-administered medications prior to visit.    Allergies  Allergen Reactions  Sulfa Antibiotics Swelling   Latex Swelling and Rash    ROS As per HPI  PE: Vitals with BMI 09/01/2021 08/31/2021 08/31/2021  Height 6' 2.5" - -  Weight 264 lbs 6 oz - -  BMI 33.5 - -  Systolic 125 131 614  Diastolic 71 80 84  Pulse 72 66 62   Orthostatics: supine 118/80, 67 upright 110/78, 67  standing 112/82, 86.  Gen: Alert, well appearing.  Patient is oriented to person, place, time, and situation. AFFECT: pleasant, lucid thought and speech. ERX:VQMG: no injection, icteris, swelling, or exudate.  EOMI, PERRLA. Mouth: lips without lesion/swelling.  Oral mucosa pink and moist. Oropharynx without erythema, exudate,  or swelling.  Neck:  full ROM, no tenderness.   No TM, lymphadenopathy, or mass. CV: RRR, no m/r/g.   LUNGS: CTA bilat, nonlabored resps, good aeration in all lung fields. EXT: no clubbing or cyanosis.  no edema.  Skin: no rash. Neuro: CN 2-12 intact bilaterally, strength 5/5 in proximal and distal upper extremities and lower extremities bilaterally.  No sensory deficits.  No tremor.  No disdiadochokinesis.  No ataxia.  Upper extremity and lower extremity DTRs symmetric.  No pronator drift.  LABS:  Lab Results  Component Value Date   TSH 0.92 08/22/2021   Lab Results  Component Value Date   WBC 8.8 08/22/2021   HGB 15.3 08/22/2021   HCT 45.4 08/22/2021   MCV 83.5 08/22/2021   PLT 225.0 08/22/2021   Lab Results  Component Value Date   CREATININE 0.89 08/22/2021   BUN 10 08/22/2021   NA 138 08/22/2021   K 3.7 08/22/2021   CL 102 08/22/2021   CO2 25 08/22/2021   Lab Results  Component Value Date   ALT 27 08/22/2021   AST 16 08/22/2021   ALKPHOS 75 08/22/2021   BILITOT 0.7 08/22/2021   Lab Results  Component Value Date   CHOL 157 08/22/2021   Lab Results  Component Value Date   HDL 39.10 08/22/2021   Lab Results  Component Value Date   LDLCALC 102 (H) 08/28/2019   Lab Results  Component Value Date   TRIG 204.0 (H) 08/22/2021   Lab Results  Component Value Date   CHOLHDL 4 08/22/2021   IMPRESSION AND PLAN:  This all fits well with panic disorder. Discussed dx extensively today, answered questions. Reviewed treatment->SSRI daily. Pt agreeable to trial of med->citalopram 20mg  qd.  He has f/u later this month to discuss hx of ADD, was on vyvanse in the past. I do think his uncontrolled ADD sx's are contributing to anxiety in general so we'll likely restart this med soon.  I think his neck pain is muscular, related to poor posture, lots of driving/turning.  Spent 32 min with pt today reviewing HPI, reviewing relevant past history, doing exam, reviewing and  discussing lab and imaging data, and formulating plans.   An After Visit Summary was printed and given to the patient.  FOLLOW UP: Return for appt already set for later this month.  Signed:  , MD           09/01/2021

## 2021-09-12 ENCOUNTER — Encounter: Payer: Self-pay | Admitting: Family Medicine

## 2021-09-12 ENCOUNTER — Other Ambulatory Visit: Payer: Self-pay | Admitting: Family Medicine

## 2021-09-12 MED ORDER — DULOXETINE HCL 20 MG PO CSDR: DELAYED_RELEASE_CAPSULE | ORAL | 0 refills | Status: DC

## 2021-09-12 NOTE — Telephone Encounter (Signed)
Stop citalopram. Wait 1 day and then start cymbalta-->I sent rx.

## 2021-09-13 MED ORDER — DULOXETINE HCL 20 MG PO CPEP: 20.0000 mg | ORAL_CAPSULE | Freq: Every day | ORAL | 0 refills | Status: DC

## 2021-09-13 NOTE — Telephone Encounter (Signed)
Please clarify rx.

## 2021-09-13 NOTE — Telephone Encounter (Signed)
I sent in cymbalta rx yesterday. I don't know why this is showing that I rx'd effexor (??). I'll do rx for 20mg  cymbalta again.

## 2021-09-14 NOTE — Telephone Encounter (Signed)
LM for pt to return call to discuss.  

## 2021-09-14 NOTE — Telephone Encounter (Signed)
Yes, all of these meds carry some risk of sexual side effects and other side effects BUT that's all it is-->a calculated risk.  By no means will everyone have the side effects listed on the package insert.  If he doesn't want to take that risk then medication is not for him.  Offer referral to counselor or psychiatrist.

## 2021-09-21 ENCOUNTER — Ambulatory Visit: Payer: BC Managed Care – PPO | Admitting: Family Medicine

## 2021-09-21 NOTE — Progress Notes (Deleted)
OFFICE VISIT  09/21/2021  CC: No chief complaint on file.   HPI:    Patient is a 30 y.o. male who presents for insomnia and hx of ADD.  Also 3 wk f/u panic d/o. A/P as of last visit: "This all fits well with panic disorder. Discussed dx extensively today, answered questions. Reviewed treatment->SSRI daily. Pt agreeable to trial of med->citalopram 20mg  qd.   He has f/u later this month to discuss hx of ADD, was on vyvanse in the past. I do think his uncontrolled ADD sx's are contributing to anxiety in general so we'll likely restart this med soon.   I think his neck pain is muscular, related to poor posture, lots of driving/turning"  INTERIM HX: Very shortly after starting citalopram he called stating he had fatigue and sexual side effects so I recommended stopping this med and doing atrial of duloxetine. He expressed concern about possible same side effects if he were to get on this med *** so we decided to hold off on further med trial at that time.    Past Medical History:  Diagnosis Date   Anxiety    panic attacks   Closed left tibial fracture 06/17/2015   Decreased libido    question of infertility 07/2019 (although wife has PCOS): referred to [facility] eval->Dr. [facility] 08/2019. Question of XYY chromosomal abnormality per pt.   GERD (gastroesophageal reflux disease)    Obesity, Class I, BMI 30-34.9     Past Surgical History:  Procedure Laterality Date   ACROMIO-CLAVICULAR JOINT REPAIR Right 11/24/2020   Procedure: RIGHT ACROMIOCLAVICULAR JOINT RECONSTRUCTION;  Surgeon: 14/12/2019, MD;  Location: [facility];  Service: Orthopedics;  Laterality: Right;   TIBIA IM NAIL INSERTION Left 06/18/2015   Procedure: INTRAMEDULLARY (IM) NAIL TIBIAL;  Surgeon: 06/20/2015, MD;  Location: [facility] OR;  Service: Orthopedics;  Laterality: Left;    Outpatient Medications Prior to Visit  Medication Sig Dispense Refill   acetaminophen (TYLENOL) 325 MG tablet Take 650 mg by  mouth every 6 (six) hours as needed. (Patient not taking: No sig reported)     Cetirizine HCl (ZYRTEC PO) Take by mouth as needed.     DULoxetine (CYMBALTA) 20 MG capsule Take 1 capsule (20 mg total) by mouth daily. 30 capsule 0   ibuprofen (ADVIL) 200 MG tablet Take 400 mg by mouth every 6 (six) hours as needed.     methocarbamol (ROBAXIN) 500 MG tablet Take 1 tablet (500 mg total) by mouth every 8 (eight) hours as needed for muscle spasms. 20 tablet 0   pantoprazole (PROTONIX) 40 MG tablet Take 40 mg by mouth daily.     traZODone (DESYREL) 50 MG tablet 1-2 tabs po qhs prn insomnia (Patient not taking: Reported on 09/01/2021) 30 tablet 1   No facility-administered medications prior to visit.    Allergies  Allergen Reactions   Sulfa Antibiotics Swelling   Latex Swelling and Rash    ROS As per HPI  PE: Vitals with BMI 09/01/2021 08/31/2021 08/31/2021  Height 6' 2.5" - -  Weight 264 lbs 6 oz - -  BMI 33.5 - -  Systolic 125 131 10/31/2021  Diastolic 71 80 84  Pulse 72 66 62     ***  LABS:    Chemistry      Component Value Date/Time   NA 138 08/22/2021 1340   K 3.7 08/22/2021 1340   CL 102 08/22/2021 1340   CO2 25 08/22/2021 1340   BUN 10 08/22/2021  1340   CREATININE 0.89 08/22/2021 1340      Component Value Date/Time   CALCIUM 9.5 08/22/2021 1340   ALKPHOS 75 08/22/2021 1340   AST 16 08/22/2021 1340   ALT 27 08/22/2021 1340   BILITOT 0.7 08/22/2021 1340     Lab Results  Component Value Date   WBC 8.8 08/22/2021   HGB 15.3 08/22/2021   HCT 45.4 08/22/2021   MCV 83.5 08/22/2021   PLT 225.0 08/22/2021   Lab Results  Component Value Date   TSH 0.92 08/22/2021   IMPRESSION AND PLAN:  No problem-specific Assessment & Plan notes found for this encounter.   An After Visit Summary was printed and given to the patient.  FOLLOW UP: No follow-ups on file.  Signed:  Santiago Bumpers, MD           09/21/2021

## 2021-09-28 ENCOUNTER — Other Ambulatory Visit: Payer: Self-pay | Admitting: Family Medicine

## 2021-10-19 ENCOUNTER — Ambulatory Visit: Payer: BC Managed Care – PPO | Admitting: Family Medicine

## 2021-10-19 ENCOUNTER — Encounter: Payer: Self-pay | Admitting: Family Medicine

## 2021-10-19 ENCOUNTER — Other Ambulatory Visit: Payer: Self-pay | Admitting: Family Medicine

## 2021-10-19 ENCOUNTER — Other Ambulatory Visit: Payer: Self-pay

## 2021-10-19 VITALS — BP 132/74 | HR 78 | Temp 98.1°F | Ht 74.0 in | Wt 265.0 lb

## 2021-10-19 DIAGNOSIS — Z7689 Persons encountering health services in other specified circumstances: Secondary | ICD-10-CM

## 2021-10-19 DIAGNOSIS — K219 Gastro-esophageal reflux disease without esophagitis: Secondary | ICD-10-CM | POA: Insufficient documentation

## 2021-10-19 DIAGNOSIS — F411 Generalized anxiety disorder: Secondary | ICD-10-CM

## 2021-10-19 DIAGNOSIS — F410 Panic disorder [episodic paroxysmal anxiety] without agoraphobia: Secondary | ICD-10-CM | POA: Diagnosis not present

## 2021-10-19 DIAGNOSIS — F331 Major depressive disorder, recurrent, moderate: Secondary | ICD-10-CM

## 2021-10-19 DIAGNOSIS — M62838 Other muscle spasm: Secondary | ICD-10-CM

## 2021-10-19 DIAGNOSIS — F5104 Psychophysiologic insomnia: Secondary | ICD-10-CM | POA: Diagnosis not present

## 2021-10-19 DIAGNOSIS — E669 Obesity, unspecified: Secondary | ICD-10-CM

## 2021-10-19 MED ORDER — FLUOXETINE HCL 10 MG PO TABS: ORAL_TABLET | ORAL | 3 refills | Status: DC

## 2021-10-19 MED ORDER — METHOCARBAMOL 500 MG PO TABS: 500.0000 mg | ORAL_TABLET | Freq: Three times a day (TID) | ORAL | 2 refills | Status: DC | PRN

## 2021-10-19 NOTE — Patient Instructions (Signed)

## 2021-10-19 NOTE — Progress Notes (Signed)
New Patient Office Visit  Subjective:  Patient ID: Marcus Austin, male    DOB: 12-11-1991  Age: 30 y.o. MRN: [id number]  CC:  Chief Complaint  Patient presents with   New Patient (Initial Visit)    HPI Marcus Austin presents to establish care. He has a history of anxiety, depression, and panic attacks. He was previously taking Celexa for this. He has been off of Celexa for a few weeks now. He felt like his anxiety was better controlled. He stopped taking this because of the sexual side effects. Since stopping this, his anxiety has been worse and he has been having more panic attacks. He is having 1-2 panic attacks every other day now. He reports chest pain, sweating, numbness in his hands with his attacks. The attacks generally last for about 10 minutes. He has trouble sleeping as well, usually because he has so much on his mind. He takes trazodone as needed but this doesn't help much and makes him groggy the following day. 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   He takes robaxin for muscle spasms in his neck. This works well for him.   He takes protonix for GERD with good relief. Denies vomiting or weight loss.   Depression screen [facility] 2/9 10/19/2021 08/22/2021 08/01/2019  Decreased Interest 0 0 0  Down, Depressed, Hopeless 1 0 0  PHQ - 2 Score 1 0 0  Altered sleeping 3 - -  Tired, decreased energy 2 - -  Change in appetite 0 - -  Feeling bad or failure about yourself  1 - -  Trouble concentrating 0 - -  Moving slowly or fidgety/restless 0 - -  Suicidal thoughts 0 - -  PHQ-9 Score 7 - -  Difficult doing work/chores Somewhat difficult - -   GAD 7 : Generalized Anxiety Score 10/19/2021  Nervous, Anxious, on Edge 3  Control/stop worrying 3  Worry too much - different things 3  Trouble relaxing 2  Restless 2  Easily annoyed or irritable 2  Afraid - awful might happen 1  Total GAD 7 Score 16  Anxiety Difficulty Somewhat difficult       Past Medical History:  Diagnosis Date   Anxiety    panic attacks   Closed left tibial fracture 06/17/2015   Decreased libido    question of infertility 07/2019 (although wife has PCOS): referred to [facility] for eval->Dr. Retta Diones 08/2019. Question of XYY chromosomal abnormality per pt.   GERD (gastroesophageal reflux disease)    Obesity, Class I, BMI 30-34.9     Past Surgical History:  Procedure Laterality Date   ACROMIO-CLAVICULAR JOINT REPAIR Right 11/24/2020   Procedure: RIGHT ACROMIOCLAVICULAR JOINT RECONSTRUCTION;  Surgeon: Tarry Kos, MD;  Location: [facility];  Service: Orthopedics;  Laterality: Right;   TIBIA IM NAIL INSERTION Left 06/18/2015   Procedure: INTRAMEDULLARY (IM) NAIL TIBIAL;  Surgeon: Teryl Lucy, MD;  [facility]: [facility];  Service: Orthopedics;  Laterality: Left;    Family History  Problem Relation Age of Onset   Arthritis Mother    Hypothyroidism Mother    Osteoarthritis Mother    Arthritis Father    Diabetes type II Father    Hypertension Father    Congestive Heart Failure Father    Benign prostatic hyperplasia Father    Anxiety disorder Father    Obesity Father    Hyperlipidemia Father    Cholecystitis Brother    Dementia Maternal Grandmother  Social History   Socioeconomic History   Marital status: Married    Spouse name: Morrie Sheldon   Number of children: 0   Years of education: 12   Highest education level: High school graduate  Occupational History   Not on file  Tobacco Use   Smoking status: Never   Smokeless tobacco: Current    Types: Chew  Vaping Use   Vaping Use: Never used  Substance and Sexual Activity   Alcohol use: Yes    Alcohol/week: 12.0 standard drinks    Types: 12 Cans of beer per week    Comment: Occasionally   Drug use: No   Sexual activity: Yes  Other Topics Concern   Not on file  Social History Narrative   Married, no children.   Raised in [place], [place].   Educ: some college.   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   CVE:LFYB   Alc: 6 pack/week   Social Determinants of Health   Financial Resource Strain: Not on file  Food Insecurity: Not on file  Transportation Needs: Not on file  Physical Activity: Not on file  Stress: Not on file  Social Connections: Not on file  Intimate Partner Violence: Not on file    ROS Review of Systems As per HPI.   Objective:   Today's Vitals: BP 132/74   Pulse 78   Temp 98.1 F (36.7 C) (Temporal)   Ht 6\' 2"  (1.88 m)   Wt 265 lb (120.2 kg)   BMI 34.02 kg/m   Physical Exam Vitals and nursing note reviewed.  Constitutional:      General: He is not in acute distress.    Appearance: He is not ill-appearing, toxic-appearing or diaphoretic.  Cardiovascular:     Rate and Rhythm: Normal rate and regular rhythm.     Heart sounds: Normal heart sounds. No murmur heard. Pulmonary:     Effort: Pulmonary effort is normal. No respiratory distress.     Breath sounds: Normal breath sounds.  Abdominal:     General: Bowel sounds are normal. There is no distension.     Palpations: Abdomen is soft.     Tenderness: There is no abdominal tenderness. There is no guarding or rebound.  Musculoskeletal:     Right lower leg: No edema.     Left lower leg: No edema.  Skin:    General: Skin is warm and dry.  Neurological:     General: No focal deficit present.     Mental Status: He is alert and oriented to person, place, and time.  Psychiatric:        Mood and Affect: Mood normal.        Behavior: Behavior normal.        Thought Content: Thought content normal.        Judgment: Judgment normal.    Assessment & Plan:   Nicholad was seen today for new patient (initial visit).  Diagnoses and all orders for this visit:  Generalized anxiety disorder Panic disorder Uncontrolled. Has failed celexa due to side effects. Will try prozac as below. Discussed counseling, he will notify the office if he would like a referral.  -      FLUoxetine (PROZAC) 10 MG tablet; Take 1 tablet now for 7 days, then increase to 2 tablets daily.  Depression, major, recurrent, moderate (HCC) Start prozac as below. Denies SI.  -     FLUoxetine (PROZAC) 10 MG tablet; Take 1 tablet now for 7 days, then  increase to 2 tablets daily.  Psychophysiological insomnia Discussed will focus on controlling anxiety as this should improve insomnia.   Obesity (BMI 30.0-34.9) Recently had labs. Diet and exercise.   Gastroesophageal reflux disease without esophagitis Well controlled on current regimen of protonix.   Muscle spasm Refill provided.  -     methocarbamol (ROBAXIN) 500 MG tablet; Take 1 tablet (500 mg total) by mouth every 8 (eight) hours as needed for muscle spasms.  Encounter to establish care Reviewed available records.   Follow-up: Return in about 6 weeks (around 11/30/2021) for televisit for medication follow up.  The patient indicates understanding of these issues and agrees with the plan.   Gabriel Earing, FNP

## 2021-10-20 ENCOUNTER — Other Ambulatory Visit: Payer: Self-pay | Admitting: Family Medicine

## 2021-10-20 DIAGNOSIS — F410 Panic disorder [episodic paroxysmal anxiety] without agoraphobia: Secondary | ICD-10-CM

## 2021-10-20 DIAGNOSIS — F331 Major depressive disorder, recurrent, moderate: Secondary | ICD-10-CM

## 2021-10-20 DIAGNOSIS — F411 Generalized anxiety disorder: Secondary | ICD-10-CM

## 2021-10-20 MED ORDER — FLUOXETINE HCL 10 MG PO TABS: 20.0000 mg | ORAL_TABLET | Freq: Every day | ORAL | 2 refills | Status: DC

## 2021-11-11 ENCOUNTER — Other Ambulatory Visit: Payer: Self-pay | Admitting: Family Medicine

## 2021-11-11 DIAGNOSIS — F411 Generalized anxiety disorder: Secondary | ICD-10-CM

## 2021-11-11 DIAGNOSIS — F410 Panic disorder [episodic paroxysmal anxiety] without agoraphobia: Secondary | ICD-10-CM

## 2021-11-11 DIAGNOSIS — F331 Major depressive disorder, recurrent, moderate: Secondary | ICD-10-CM

## 2021-12-01 ENCOUNTER — Encounter: Payer: Self-pay | Admitting: Family Medicine

## 2021-12-01 ENCOUNTER — Ambulatory Visit (INDEPENDENT_AMBULATORY_CARE_PROVIDER_SITE_OTHER): Payer: BC Managed Care – PPO | Admitting: Family Medicine

## 2021-12-01 DIAGNOSIS — F411 Generalized anxiety disorder: Secondary | ICD-10-CM

## 2021-12-01 DIAGNOSIS — F410 Panic disorder [episodic paroxysmal anxiety] without agoraphobia: Secondary | ICD-10-CM | POA: Diagnosis not present

## 2021-12-01 DIAGNOSIS — F331 Major depressive disorder, recurrent, moderate: Secondary | ICD-10-CM

## 2021-12-01 MED ORDER — FLUOXETINE HCL 20 MG PO TABS: ORAL_TABLET | ORAL | 3 refills | Status: DC

## 2021-12-01 NOTE — Progress Notes (Signed)
Virtual Visit  Note Due to COVID-19 pandemic this visit was conducted virtually. This visit type was conducted due to national recommendations for restrictions regarding the COVID-19 Pandemic (e.g. social distancing, sheltering in place) in an effort to limit this patient's exposure and mitigate transmission in our community. All issues noted in this document were discussed and addressed.  A physical exam was not performed with this format.  I connected with Marthe Patch on 12/01/21 at 1252 by telephone and verified that I am speaking with the correct person using two identifiers. Dhruv Christina is currently located in his car and no one is currently with him during the visit. The provider, Gabriel Earing, FNP is located in their office at time of visit.  I discussed the limitations, risks, security and privacy concerns of performing an evaluation and management service by telephone and the availability of in person appointments. I also discussed with the patient that there may be a patient responsible charge related to this service. The patient expressed understanding and agreed to proceed.  CC: anxiety, depression  History and Present Illness:  HPI Jaivion has been on prozac for 6 weeks. He has been taking 10 mg daily. He does feel like this has helped his anxiety. He has only had 2 mild panic attacks since starting on prozac. He denies side effects. He does report more fatigue since starting prozac.   Depression screen [facility] 2/9 12/01/2021 10/19/2021 08/22/2021  Decreased Interest 3 0 0  Down, Depressed, Hopeless 1 1 0  PHQ - 2 Score 4 1 0  Altered sleeping 3 3 -  Tired, decreased energy 3 2 -  Change in appetite 1 0 -  Feeling bad or failure about yourself  0 1 -  Trouble concentrating 1 0 -  Moving slowly or fidgety/restless 1 0 -  Suicidal thoughts 0 0 -  PHQ-9 Score 13 7 -  Difficult doing work/chores Somewhat difficult Somewhat difficult -   GAD 7 : Generalized Anxiety Score 12/01/2021  10/19/2021  Nervous, Anxious, on Edge 0 3  Control/stop worrying 1 3  Worry too much - different things 0 3  Trouble relaxing 0 2  Restless 1 2  Easily annoyed or irritable 0 2  Afraid - awful might happen 0 1  Total GAD 7 Score 2 16  Anxiety Difficulty Not difficult at all Somewhat difficult     ROS As per HPI.   Observations/Objective: Alert and oriented x 3. Able to speak in full sentences without difficulty.   Assessment and Plan: Jaimere was seen today for anxiety and depression.  Diagnoses and all orders for this visit:  Depression, major, recurrent, moderate (HCC) PHQ score increased since starting prozac. Discussed increasing prozac vs adding Wellbutrin. He would like to increase prozac for now. Discussed follow up in 6 weeks. If symptoms worsen with increase in prozac, will drop back down to 10 mg and add Wellbutrin.  -     FLUoxetine (PROZAC) 20 MG tablet; Take one tablet daily  Generalized anxiety disorder Panic disorder Well controlled on current regimen.  -     FLUoxetine (PROZAC) 20 MG tablet; Take one tablet daily    Follow Up Instructions: 6 weeks for medication follow up.    I discussed the assessment and treatment plan with the patient. The patient was provided an opportunity to ask questions and all were answered. The patient agreed with the plan and demonstrated an understanding of the instructions.   The patient was advised to call  back or seek an in-person evaluation if the symptoms worsen or if the condition fails to improve as anticipated.  The above assessment and management plan was discussed with the patient. The patient verbalized understanding of and has agreed to the management plan. Patient is aware to call the clinic if symptoms persist or worsen. Patient is aware when to return to the clinic for a follow-up visit. Patient educated on when it is appropriate to go to the emergency department.   Time call ended:  1305  I provided 13 minutes  of  non face-to-face time during this encounter.    Gabriel Earing, FNP

## 2021-12-03 ENCOUNTER — Encounter: Payer: Self-pay | Admitting: Family Medicine

## 2021-12-05 ENCOUNTER — Other Ambulatory Visit: Payer: Self-pay | Admitting: Family Medicine

## 2021-12-05 DIAGNOSIS — F331 Major depressive disorder, recurrent, moderate: Secondary | ICD-10-CM

## 2021-12-05 MED ORDER — BUPROPION HCL ER (XL) 150 MG PO TB24: 150.0000 mg | ORAL_TABLET | Freq: Every day | ORAL | 1 refills | Status: DC

## 2022-01-11 ENCOUNTER — Encounter: Payer: Self-pay | Admitting: Family Medicine

## 2022-01-11 ENCOUNTER — Ambulatory Visit: Payer: BC Managed Care – PPO | Admitting: Family Medicine

## 2022-01-11 VITALS — BP 140/82 | HR 73 | Temp 97.7°F | Ht 74.0 in | Wt 269.8 lb

## 2022-01-11 DIAGNOSIS — F331 Major depressive disorder, recurrent, moderate: Secondary | ICD-10-CM | POA: Diagnosis not present

## 2022-01-11 DIAGNOSIS — R030 Elevated blood-pressure reading, without diagnosis of hypertension: Secondary | ICD-10-CM | POA: Diagnosis not present

## 2022-01-11 DIAGNOSIS — F411 Generalized anxiety disorder: Secondary | ICD-10-CM | POA: Diagnosis not present

## 2022-01-11 NOTE — Progress Notes (Signed)
Assessment & Plan:  1-2. Depression, major, recurrent, moderate (HCC)/Generalized anxiety disorder Controlled, but with side effects of medication. GeneSight testing completed today.   3. Elevated BP without diagnosis of hypertension Education provided on the DASH diet. Encouraged healthy eating and exercise to decrease blood pressure in order to prevent going on blood pressure medication.    Return in about 2 months (around 03/11/2022) for Depression/Anxiety.  Marcus BostonBritney Saurabh Hettich, MSN, APRN, FNP-C [facility]  Subjective:    Patient ID: Marcus Austin, male    DOB: 06/11/1991, 31 y.o.   MRN: [id number]  Patient Care Team: Gabriel EaringMorgan, Tiffany M, FNP as PCP - General (Family Medicine) Marcine Matarahlstedt, Stephen, MD as Consulting Physician (Urology)   Chief Complaint:  Chief Complaint  Patient presents with   genesight    HPI: Marcus Austin is a 31 y.o. male presenting on 01/11/2022 for genesight  Patient is here for GeneSight testing. He is currently on Prozac, which is working but causing a decreased libido. He was also taking Wellbutrin to combat the previous side effect which he stopped recently due to not feeling like himself. He has previously failed treatment with Celexa.   [facility] 01/11/2022 12/01/2021 10/19/2021  Decreased Interest 1 3 0  Down, Depressed, Hopeless 1 1 1   PHQ - 2 Score 2 4 1   Altered sleeping 2 3 3   Tired, decreased energy 2 3 2   Change in appetite 1 1 0  Feeling bad or failure about yourself  0 0 1  Trouble concentrating 0 1 0  Moving slowly or fidgety/restless 0 1 0  Suicidal thoughts 0 0 0  PHQ-9 Score 7 13 7   Difficult doing work/chores Somewhat difficult Somewhat difficult Somewhat difficult   GAD 7 : Generalized Anxiety Score 01/11/2022 12/01/2021 10/19/2021  Nervous, Anxious, on Edge 0 0 3  Control/stop worrying 0 1 3  Worry too much - different things 0 0 3  Trouble relaxing 0 0 2  Restless 0 1 2  Easily annoyed or  irritable 1 0 2  Afraid - awful might happen 0 0 1  Total GAD 7 Score 1 2 16   Anxiety Difficulty Not difficult at all Not difficult at all Somewhat difficult    New complaints: None  Social history:  Relevant past medical, surgical, family and social history reviewed and updated as indicated. Interim medical history since our last visit reviewed.  Allergies and medications reviewed and updated.  DATA REVIEWED: CHART IN EPIC  ROS: Negative unless specifically indicated above in HPI.    Current Outpatient Medications:    buPROPion (WELLBUTRIN XL) 150 MG 24 hr tablet, Take 1 tablet (150 mg total) by mouth daily., Disp: 90 tablet, Rfl: 1   Cetirizine HCl (ZYRTEC PO), Take by mouth as needed., Disp: , Rfl:    FLUoxetine (PROZAC) 20 MG tablet, Take one tablet daily, Disp: 90 tablet, Rfl: 3   ibuprofen (ADVIL) 200 MG tablet, Take 400 mg by mouth every 6 (six) hours as needed., Disp: , Rfl:    methocarbamol (ROBAXIN) 500 MG tablet, Take 1 tablet (500 mg total) by mouth every 8 (eight) hours as needed for muscle spasms., Disp: 20 tablet, Rfl: 2   pantoprazole (PROTONIX) 40 MG tablet, Take 40 mg by mouth daily., Disp: , Rfl:    Allergies  Allergen Reactions   Sulfa Antibiotics Swelling   Latex Swelling and Rash   Past Medical History:  Diagnosis Date   Anxiety    panic attacks   Closed  left tibial fracture 06/17/2015   Decreased libido    question of infertility 07/2019 (although wife has PCOS): referred to [facility] eval->Dr. Retta Diones 08/2019. Question of XYY chromosomal abnormality per pt.   GERD (gastroesophageal reflux disease)    Obesity, Class I, BMI 30-34.9     Past Surgical History:  Procedure Laterality Date   ACROMIO-CLAVICULAR JOINT REPAIR Right 11/24/2020   Procedure: RIGHT ACROMIOCLAVICULAR JOINT RECONSTRUCTION;  Surgeon: Tarry Kos, MD;  Location: [facility];  Service: Orthopedics;  Laterality: Right;   TIBIA IM NAIL INSERTION Left 06/18/2015    Procedure: INTRAMEDULLARY (IM) NAIL TIBIAL;  Surgeon: Teryl Lucy, MD;  Location: [facility];  Service: Orthopedics;  Laterality: Left;    Social History   Socioeconomic History   Marital status: Married    Spouse name: Marcus Austin   Number of children: 0   Years of education: 12   Highest education level: High school graduate  Occupational History   Not on file  Tobacco Use   Smoking status: Never   Smokeless tobacco: Current    Types: Chew  Vaping Use   Vaping Use: Never used  Substance and Sexual Activity   Alcohol use: Yes    Alcohol/week: 12.0 standard drinks    Types: 12 Cans of beer per week    Comment: Occasionally   Drug use: No   Sexual activity: Yes  Other Topics Concern   Not on file  Social History Narrative   Married, no children.   Raised in [place], [place].   Educ: some college.   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.   CLE:XNTZ   Alc: 6 pack/week   Social Determinants of Health   Financial Resource Strain: Not on file  Food Insecurity: Not on file  Transportation Needs: Not on file  Physical Activity: Not on file  Stress: Not on file  Social Connections: Not on file  Intimate Partner Violence: Not on file        Objective:    BP 140/82    Pulse 73    Temp 97.7 F (36.5 C) (Temporal)    Ht 6\' 2"  (1.88 m)    Wt 269 lb 12.8 oz (122.4 kg)    SpO2 96%    BMI 34.64 kg/m   Wt Readings from Last 3 Encounters:  01/11/22 269 lb 12.8 oz (122.4 kg)  10/19/21 265 lb (120.2 kg)  09/01/21 264 lb 6.4 oz (119.9 kg)    Physical Exam Vitals reviewed.  Constitutional:      General: He is not in acute distress.    Appearance: Normal appearance. He is obese. He is not ill-appearing, toxic-appearing or diaphoretic.  HENT:     Head: Normocephalic and atraumatic.  Eyes:     General: No scleral icterus.       Right eye: No discharge.        Left eye: No discharge.     Conjunctiva/sclera: Conjunctivae normal.  Cardiovascular:     Rate and Rhythm: Normal  rate and regular rhythm.     Heart sounds: Normal heart sounds. No murmur heard.   No friction rub. No gallop.  Pulmonary:     Effort: Pulmonary effort is normal. No respiratory distress.     Breath sounds: Normal breath sounds. No stridor. No wheezing, rhonchi or rales.  Musculoskeletal:        General: Normal range of motion.     Cervical back: Normal range of motion.  Skin:    General:  Skin is warm and dry.  Neurological:     Mental Status: He is alert and oriented to person, place, and time. Mental status is at baseline.  Psychiatric:        Mood and Affect: Mood normal.        Behavior: Behavior normal.        Thought Content: Thought content normal.        Judgment: Judgment normal.    Lab Results  Component Value Date   TSH 0.92 08/22/2021   Lab Results  Component Value Date   WBC 8.8 08/22/2021   HGB 15.3 08/22/2021   HCT 45.4 08/22/2021   MCV 83.5 08/22/2021   PLT 225.0 08/22/2021   Lab Results  Component Value Date   NA 138 08/22/2021   K 3.7 08/22/2021   CO2 25 08/22/2021   GLUCOSE 75 08/22/2021   BUN 10 08/22/2021   CREATININE 0.89 08/22/2021   BILITOT 0.7 08/22/2021   ALKPHOS 75 08/22/2021   AST 16 08/22/2021   ALT 27 08/22/2021   PROT 7.8 08/22/2021   ALBUMIN 4.4 08/22/2021   CALCIUM 9.5 08/22/2021   ANIONGAP 7 06/17/2015   GFR 113.73 08/22/2021   Lab Results  Component Value Date   CHOL 157 08/22/2021   Lab Results  Component Value Date   HDL 39.10 08/22/2021   Lab Results  Component Value Date   LDLCALC 102 (H) 08/28/2019   Lab Results  Component Value Date   TRIG 204.0 (H) 08/22/2021   Lab Results  Component Value Date   CHOLHDL 4 08/22/2021   No results found for: HGBA1C

## 2022-01-19 ENCOUNTER — Telehealth: Payer: Self-pay | Admitting: Family Medicine

## 2022-01-19 NOTE — Telephone Encounter (Signed)
Patient aware and states he will stay on the prozac a little longer and then let us know if he needs to change his medication.

## 2022-01-19 NOTE — Telephone Encounter (Signed)
Please let patient know I have received his GeneSight test results. Prozac is on his list as a good medication. Would he like to continue it a little longer or switch to something different such as Viibryd?

## 2022-02-21 ENCOUNTER — Telehealth: Payer: Self-pay | Admitting: Family Medicine

## 2022-02-21 ENCOUNTER — Encounter: Payer: Self-pay | Admitting: Family Medicine

## 2022-02-22 NOTE — Telephone Encounter (Signed)
Addressed in MyChart encounter.

## 2022-02-22 NOTE — Telephone Encounter (Signed)
Marcus Austin, you saw patient on 01/11/22 for depression, he is on Prozac. ?

## 2022-03-14 ENCOUNTER — Ambulatory Visit: Payer: BC Managed Care – PPO | Admitting: Family Medicine

## 2022-03-16 ENCOUNTER — Encounter: Payer: Self-pay | Admitting: Family Medicine

## 2022-03-16 ENCOUNTER — Ambulatory Visit: Payer: BC Managed Care – PPO | Admitting: Family Medicine

## 2022-03-16 VITALS — BP 136/89 | HR 74 | Temp 98.0°F | Ht 74.0 in | Wt 266.8 lb

## 2022-03-16 DIAGNOSIS — F331 Major depressive disorder, recurrent, moderate: Secondary | ICD-10-CM

## 2022-03-16 DIAGNOSIS — E669 Obesity, unspecified: Secondary | ICD-10-CM

## 2022-03-16 DIAGNOSIS — R030 Elevated blood-pressure reading, without diagnosis of hypertension: Secondary | ICD-10-CM

## 2022-03-16 DIAGNOSIS — F410 Panic disorder [episodic paroxysmal anxiety] without agoraphobia: Secondary | ICD-10-CM | POA: Diagnosis not present

## 2022-03-16 DIAGNOSIS — F411 Generalized anxiety disorder: Secondary | ICD-10-CM | POA: Diagnosis not present

## 2022-03-16 MED ORDER — FLUOXETINE HCL 20 MG PO TABS: 10.0000 mg | ORAL_TABLET | Freq: Every day | ORAL | 3 refills | Status: DC

## 2022-03-16 MED ORDER — SEMAGLUTIDE-WEIGHT MANAGEMENT 0.25 MG/0.5ML ~~LOC~~ SOAJ: 0.2500 mg | SUBCUTANEOUS | 0 refills | Status: DC

## 2022-03-16 NOTE — Progress Notes (Signed)
? ?Assessment & Plan:  ?1-3. Generalized anxiety disorder/Depression, major, recurrent, moderate (HCC)/Panic disorder ?Controlled, but patient does not seem to be himself on the medication.  He is going to decrease his dose from 20 mg down to 10 mg and see how he feels as well as continuing to assess if he feels tired on the medication. ?- FLUoxetine (PROZAC) 20 MG tablet; Take 0.5 tablets (10 mg total) by mouth daily.  Dispense: 90 tablet; Refill: 3 ? ?4. Elevated BP without diagnosis of hypertension ?Suspect patient's blood pressure is elevated due to increased anxiety at seeing me as a provider he knows personally.  His PCP has returned from maternity leave and he will be following up with her in the future. ? ?5. Obesity (BMI 30.0-34.9) ?Continue healthy eating and exercise.  We will see if we can get Wegovy covered through his insurance. ?- Semaglutide-Weight Management 0.25 MG/0.5ML SOAJ; Inject 0.25 mg into the skin once a week.  Dispense: 2 mL; Refill: 0 ? ? ?Return in about 4 weeks (around 04/13/2022) for weight with PCP. ? ?Deliah BostonBritney Tenille Morrill, MSN, APRN, FNP-C ?[facility] ? ?Subjective:  ? ? Patient ID: Marcus Austin, male    DOB: 02/05/1991, 31 y.o.   MRN: [id number] ? ?Patient Care Team: ?Gabriel EaringMorgan, Tiffany M, FNP as PCP - General (Family Medicine) ?Marcine Matarahlstedt, Stephen, MD as Consulting Physician (Urology)  ? ?Chief Complaint:  ?Chief Complaint  ?Patient presents with  ? Depression  ? Anxiety  ?  2 month follow up- Patient states he it is going good except the medication makes him very sleepy.   ? ? ?HPI: ?Marcus Austin is a 31 y.o. male presenting on 03/16/2022 for Depression and Anxiety (2 month follow up- Patient states he it is going good except the medication makes him very sleepy. ) ? ?Anxiety/depression: Patient is currently taking Prozac 20 mg once daily in the morning.  He reports he feels sleepy for a few hours after taking the medication.  He has tried taking it at bedtime, but did not  feel like it lasted throughout the day when he did this.  He has recently tried taking half tablet (10 mg) and states he was not feeling sleepy on this dosage.  He reports missing the medication for a couple of days and states during that time he was his normal hyper, active, crazy self.  He states when he takes the medication he feels very mellowed out and if he has nothing to do he will just lay on the couch all day.  Denies feeling like he is not himself or like a zombie on the medication.  He previously failed treatment with Wellbutrin and Celexa.  He has completed GeneSight testing. ? ? ?  03/16/2022  ?  3:35 PM 01/11/2022  ?  8:30 AM 12/01/2021  ? 12:53 PM  ?Depression screen PHQ 2/9  ?Decreased Interest 3 1 3   ?Down, Depressed, Hopeless 0 1 1  ?PHQ - 2 Score 3 2 4   ?Altered sleeping 1 2 3   ?Tired, decreased energy 1 2 3   ?Change in appetite 0 1 1  ?Feeling bad or failure about yourself  0 0 0  ?Trouble concentrating 0 0 1  ?Moving slowly or fidgety/restless 0 0 1  ?Suicidal thoughts 0 0 0  ?PHQ-9 Score 5 7 13   ?Difficult doing work/chores Somewhat difficult Somewhat difficult Somewhat difficult  ? ? ?  03/16/2022  ?  3:35 PM 01/11/2022  ?  8:30 AM 12/01/2021  ? 12:55  PM 10/19/2021  ?  2:57 PM  ?GAD 7 : Generalized Anxiety Score  ?Nervous, Anxious, on Edge 0 0 0 3  ?Control/stop worrying 0 0 1 3  ?Worry too much - different things 0 0 0 3  ?Trouble relaxing 1 0 0 2  ?Restless 0 0 1 2  ?Easily annoyed or irritable 1 1 0 2  ?Afraid - awful might happen 0 0 0 1  ?Total GAD 7 Score 2 1 2 16   ?Anxiety Difficulty Somewhat difficult Not difficult at all Not difficult at all Somewhat difficult  ? ? 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  He feels like his blood pressure is elevated when he has to come see me since we know each other personally. ? ?Obesity: Patient is questioning a medication for weight loss.  He has been exercising and trying to eat  healthy. ? ?New complaints: ?None ? ?Social history: ? ?Relevant past medical, surgical, family and social history reviewed and updated as indicated. Interim medical history since our last visit reviewed. ? ?Allergies and medications reviewed and updated. ? ?DATA REVIEWED: CHART IN EPIC ? ?ROS: Negative unless specifically indicated above in HPI.  ? ? ?Current Outpatient Medications:  ?  Cetirizine HCl (ZYRTEC PO), Take by mouth as needed., Disp: , Rfl:  ?  FLUoxetine (PROZAC) 20 MG tablet, Take one tablet daily, Disp: 90 tablet, Rfl: 3 ?  ibuprofen (ADVIL) 200 MG tablet, Take 400 mg by mouth every 6 (six) hours as needed., Disp: , Rfl:  ?  methocarbamol (ROBAXIN) 500 MG tablet, Take 1 tablet (500 mg total) by mouth every 8 (eight) hours as needed for muscle spasms. (Patient not taking: Reported on 03/16/2022), Disp: 20 tablet, Rfl: 2 ?  pantoprazole (PROTONIX) 40 MG tablet, Take 40 mg by mouth daily. (Patient not taking: Reported on 03/16/2022), Disp: , Rfl:   ? ?Allergies  ?Allergen Reactions  ? Sulfa Antibiotics Swelling  ? Latex Swelling and Rash  ? ?Past Medical History:  ?Diagnosis Date  ? Anxiety   ? panic attacks  ? Closed left tibial fracture 06/17/2015  ? Decreased libido   ? question of infertility 07/2019 (although wife has PCOS): referred to [facility] for eval->Dr. [facility] 08/2019. Question of XYY chromosomal abnormality per pt.  ? GERD (gastroesophageal reflux disease)   ? Obesity, Class I, BMI 30-34.9   ?  ?Past Surgical History:  ?Procedure Laterality Date  ? ACROMIO-CLAVICULAR JOINT REPAIR Right 11/24/2020  ? Procedure: RIGHT ACROMIOCLAVICULAR JOINT RECONSTRUCTION;  Surgeon: 14/12/2019, MD;  Location: [facility];  Service: Orthopedics;  Laterality: Right;  ? TIBIA IM NAIL INSERTION Left 06/18/2015  ? Procedure: INTRAMEDULLARY (IM) NAIL TIBIAL;  Surgeon: 06/20/2015, MD;  Location: [facility] OR;  Service: Orthopedics;  Laterality: Left;  ?  ?Social History  ? ?Socioeconomic History  ? Marital  status: Married  ?  Spouse name: Teryl Lucy  ? Number of children: 0  ? Years of education: 58  ? Highest education level: High school graduate  ?Occupational History  ? Not on file  ?Tobacco Use  ? Smoking status: Never  ? Smokeless tobacco: Current  ?  Types: Chew  ?Vaping Use  ? Vaping Use: Never used  ?Substance and Sexual Activity  ? Alcohol use: Yes  ?  Alcohol/week: 12.0 standard drinks  ?  Types: 12 Cans of beer per week  ?  Comment: Occasionally  ? Drug use: No  ?  Sexual activity: Yes  ?Other Topics Concern  ? Not on file  ?Social History Narrative  ? Married, no children.  ? Raised in [place], [place].  ? Educ: some college.  ? 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  ? KCL:EXNT  ? Alc: 6 pack/week  ? ?Social Determinants of Health  ? ?Financial Resource Strain: Not on file  ?Food Insecurity: Not on file  ?Transportation Needs: Not on file  ?Physical Activity: Not on file  ?Stress: Not on file  ?Social Connections: Not on file  ?Intimate Partner Violence: Not on file  ?  ? ?   ?Objective:  ?  ?BP 136/89   Pulse 74   Temp 98 ?F (36.7 ?C) (Temporal)   Ht 6\' 2"  (1.88 m)   Wt 266 lb 12.8 oz (121 kg)   SpO2 96%   BMI 34.26 kg/m?  ? ?Wt Readings from Last 3 Encounters:  ?03/16/22 266 lb 12.8 oz (121 kg)  ?01/11/22 269 lb 12.8 oz (122.4 kg)  ?10/19/21 265 lb (120.2 kg)  ? ? ?Physical Exam ?Vitals reviewed.  ?Constitutional:   ?   General: He is not in acute distress. ?   Appearance: Normal appearance. He is obese. He is not ill-appearing, toxic-appearing or diaphoretic.  ?HENT:  ?   Head: Normocephalic and atraumatic.  ?Eyes:  ?   General: No scleral icterus.    ?   Right eye: No discharge.     ?   Left eye: No discharge.  ?   Conjunctiva/sclera: Conjunctivae normal.  ?Cardiovascular:  ?   Rate and Rhythm: Normal rate and regular rhythm.  ?   Heart sounds: Normal heart sounds. No murmur heard. ?  No friction rub. No gallop.  ?Pulmonary:  ?   Effort: Pulmonary effort is normal. No respiratory distress.  ?    Breath sounds: Normal breath sounds. No stridor. No wheezing, rhonchi or rales.  ?Musculoskeletal:     ?   General: Normal range of motion.  ?   Cervical back: Normal range of motion.  ?Skin: ?   General: Skin is

## 2022-04-12 ENCOUNTER — Ambulatory Visit: Payer: BC Managed Care – PPO | Admitting: Family Medicine

## 2022-04-13 ENCOUNTER — Ambulatory Visit: Payer: BC Managed Care – PPO | Admitting: Family Medicine

## 2022-08-02 ENCOUNTER — Encounter: Payer: Self-pay | Admitting: Family Medicine

## 2022-08-02 ENCOUNTER — Ambulatory Visit: Payer: BC Managed Care – PPO | Admitting: Family Medicine

## 2022-08-02 VITALS — BP 123/73 | HR 71 | Temp 98.1°F | Ht 74.0 in | Wt 268.5 lb

## 2022-08-02 DIAGNOSIS — F410 Panic disorder [episodic paroxysmal anxiety] without agoraphobia: Secondary | ICD-10-CM

## 2022-08-02 DIAGNOSIS — F411 Generalized anxiety disorder: Secondary | ICD-10-CM

## 2022-08-02 MED ORDER — BUSPIRONE HCL 5 MG PO TABS: 5.0000 mg | ORAL_TABLET | Freq: Two times a day (BID) | ORAL | 1 refills | Status: DC

## 2022-08-02 NOTE — Progress Notes (Signed)
Established Patient Office Visit  Subjective   Patient ID: Marcus Austin, male    DOB: 1991/04/22  Age: 31 y.o. MRN: [id number]  Chief Complaint  Patient presents with   Medical Management of Chronic Issues   Anxiety    Anxiety     He reports increased anxiety for the last few weeks. He had been taking prozac 20 mg daily for months and felt that his anxiety was well controlled with this however he was having headaches and felt fatigued. He decreased his dosage of prozac to 10 mg daily about a month ago. He denies side effects with this dosage but his anxiety is not as well controlled. He had a panic attack yesterday. This occurred while driving. Once he stopped driving, he felt better. He has failed cymbalta and wellbutrin in the past.      08/02/2022   11:04 AM 03/16/2022    3:35 PM 01/11/2022    8:30 AM  Depression screen PHQ 2/9  Decreased Interest 0 3 1  Down, Depressed, Hopeless 0 0 1  PHQ - 2 Score 0 3 2  Altered sleeping 0 1 2  Tired, decreased energy 1 1 2   Change in appetite 0 0 1  Feeling bad or failure about yourself  0 0 0  Trouble concentrating 0 0 0  Moving slowly or fidgety/restless 0 0 0  Suicidal thoughts 0 0 0  PHQ-9 Score 1 5 7   Difficult doing work/chores Somewhat difficult Somewhat difficult Somewhat difficult      08/02/2022   11:05 AM 03/16/2022    3:35 PM 01/11/2022    8:30 AM 12/01/2021   12:55 PM  GAD 7 : Generalized Anxiety Score  Nervous, Anxious, on Edge 2 0 0 0  Control/stop worrying 1 0 0 1  Worry too much - different things 1 0 0 0  Trouble relaxing 1 1 0 0  Restless 0 0 0 1  Easily annoyed or irritable 1 1 1  0  Afraid - awful might happen 0 0 0 0  Total GAD 7 Score 6 2 1 2   Anxiety Difficulty Somewhat difficult Somewhat difficult Not difficult at all Not difficult at all     Past Medical History:  Diagnosis Date   Anxiety    panic attacks   Closed left tibial fracture 06/17/2015   Decreased libido    question of infertility 07/2019  (although wife has PCOS): referred to [facility] for eval->Dr. 08/2019. Question of XYY chromosomal abnormality per pt.   GERD (gastroesophageal reflux disease)    Obesity, Class I, BMI 30-34.9       ROS As per HPI.    Objective:     BP 123/73   Pulse 71   Temp 98.1 F (36.7 C) (Temporal)   Ht 6\' 2"  (1.88 m)   Wt 268 lb 8 oz (121.8 kg)   SpO2 96%   BMI 34.47 kg/m    Physical Exam Vitals and nursing note reviewed.  Constitutional:      General: He is not in acute distress.    Appearance: He is not ill-appearing, toxic-appearing or diaphoretic.  Cardiovascular:     Rate and Rhythm: Normal rate and regular rhythm.     Heart sounds: Normal heart sounds. No murmur heard. Pulmonary:     Effort: Pulmonary effort is normal. No respiratory distress.     Breath sounds: Normal breath sounds.  Musculoskeletal:     Right lower leg: No edema.     Left lower  leg: No edema.  Skin:    General: Skin is warm and dry.  Neurological:     General: No focal deficit present.     Mental Status: He is alert and oriented to person, place, and time.  Psychiatric:        Mood and Affect: Mood normal.        Behavior: Behavior normal.      No results found for any visits on 08/02/22.    The ASCVD Risk score (Arnett DK, et al., 2019) failed to calculate for the following reasons:   The 2019 ASCVD risk score is only valid for ages 15 to 49    Assessment & Plan:   Marcus Austin was seen today for medical management of chronic issues and anxiety.  Diagnoses and all orders for this visit:  Generalized anxiety disorder Panic disorder Not well controlled. Continue prozac 10 mg daily and add buspar 5 mg BID. Follow up in 6 weeks, sooner for new or worsening symptoms.  -     busPIRone (BUSPAR) 5 MG tablet; Take 1 tablet (5 mg total) by mouth 2 (two) times daily.  Return in about 6 weeks (around 09/13/2022) for medication follow up.   The patient indicates understanding of these issues and  agrees with the plan.   Gabriel Earing, FNP

## 2022-08-02 NOTE — Patient Instructions (Signed)

## 2022-08-17 ENCOUNTER — Other Ambulatory Visit: Payer: Self-pay | Admitting: Family Medicine

## 2022-08-17 DIAGNOSIS — F410 Panic disorder [episodic paroxysmal anxiety] without agoraphobia: Secondary | ICD-10-CM

## 2022-08-17 DIAGNOSIS — F411 Generalized anxiety disorder: Secondary | ICD-10-CM

## 2022-12-14 ENCOUNTER — Other Ambulatory Visit: Payer: Self-pay | Admitting: Family Medicine

## 2022-12-14 DIAGNOSIS — F410 Panic disorder [episodic paroxysmal anxiety] without agoraphobia: Secondary | ICD-10-CM

## 2022-12-14 DIAGNOSIS — F411 Generalized anxiety disorder: Secondary | ICD-10-CM

## 2022-12-14 DIAGNOSIS — F331 Major depressive disorder, recurrent, moderate: Secondary | ICD-10-CM

## 2023-03-01 ENCOUNTER — Other Ambulatory Visit: Payer: Self-pay

## 2023-03-01 ENCOUNTER — Other Ambulatory Visit: Payer: Self-pay | Admitting: Family Medicine

## 2023-03-01 DIAGNOSIS — F410 Panic disorder [episodic paroxysmal anxiety] without agoraphobia: Secondary | ICD-10-CM

## 2023-03-01 DIAGNOSIS — F331 Major depressive disorder, recurrent, moderate: Secondary | ICD-10-CM

## 2023-03-01 DIAGNOSIS — F411 Generalized anxiety disorder: Secondary | ICD-10-CM

## 2023-03-01 MED ORDER — FLUOXETINE HCL 20 MG PO TABS: 20.0000 mg | ORAL_TABLET | Freq: Every day | ORAL | 0 refills | Status: DC

## 2023-03-16 ENCOUNTER — Other Ambulatory Visit: Payer: Self-pay | Admitting: Family Medicine

## 2023-03-16 DIAGNOSIS — F331 Major depressive disorder, recurrent, moderate: Secondary | ICD-10-CM

## 2023-03-16 DIAGNOSIS — F410 Panic disorder [episodic paroxysmal anxiety] without agoraphobia: Secondary | ICD-10-CM

## 2023-03-16 DIAGNOSIS — F411 Generalized anxiety disorder: Secondary | ICD-10-CM

## 2023-06-11 ENCOUNTER — Emergency Department
Admission: EM | Admit: 2023-06-11 | Discharge: 2023-06-11 | Disposition: A | Discharge status: Home / Self Care | Payer: Worker's Compensation | Attending: Emergency Medicine | Admitting: Emergency Medicine

## 2023-06-11 ENCOUNTER — Other Ambulatory Visit: Payer: Self-pay

## 2023-06-11 ENCOUNTER — Encounter: Payer: Self-pay | Admitting: Radiology

## 2023-06-11 DIAGNOSIS — T675XXA Heat exhaustion, unspecified, initial encounter: Secondary | ICD-10-CM | POA: Diagnosis not present

## 2023-06-11 DIAGNOSIS — R5381 Other malaise: Secondary | ICD-10-CM | POA: Diagnosis present

## 2023-06-11 LAB — CBC
HCT: 44.2 % (ref 39.0–52.0)
Hemoglobin: 15 g/dL (ref 13.0–17.0)
MCH: 28.1 pg (ref 26.0–34.0)
MCHC: 33.9 g/dL (ref 30.0–36.0)
MCV: 82.9 fL (ref 80.0–100.0)
Platelets: 200 10*3/uL (ref 150–400)
RBC: 5.33 MIL/uL (ref 4.22–5.81)
RDW: 13.1 % (ref 11.5–15.5)
WBC: 8 10*3/uL (ref 4.0–10.5)
nRBC: 0 % (ref 0.0–0.2)

## 2023-06-11 LAB — COMPREHENSIVE METABOLIC PANEL
ALT: 36 U/L (ref 0–44)
AST: 29 U/L (ref 15–41)
Albumin: 4.5 g/dL (ref 3.5–5.0)
Alkaline Phosphatase: 59 U/L (ref 38–126)
Anion gap: 9 (ref 5–15)
BUN: 11 mg/dL (ref 6–20)
CO2: 23 mmol/L (ref 22–32)
Calcium: 8.8 mg/dL — ABNORMAL LOW (ref 8.9–10.3)
Chloride: 103 mmol/L (ref 98–111)
Creatinine, Ser: 0.88 mg/dL (ref 0.61–1.24)
GFR, Estimated: >60 mL/min (ref >60)
Glucose, Bld: 126 mg/dL — ABNORMAL HIGH (ref 70–99)
Potassium: 3.2 mmol/L — ABNORMAL LOW (ref 3.5–5.1)
Sodium: 135 mmol/L (ref 135–145)
Total Bilirubin: 0.7 mg/dL (ref 0.3–1.2)
Total Protein: 7.7 g/dL (ref 6.5–8.1)

## 2023-06-11 LAB — URINALYSIS, ROUTINE W REFLEX MICROSCOPIC
Bilirubin Urine: NEGATIVE
Glucose, UA: NEGATIVE mg/dL
Hgb urine dipstick: NEGATIVE
Ketones, ur: NEGATIVE mg/dL
Leukocytes,Ua: NEGATIVE
Nitrite: NEGATIVE
Protein, ur: NEGATIVE mg/dL
Specific Gravity, Urine: 1.012 (ref 1.005–1.030)
pH: 7 (ref 5.0–8.0)

## 2023-06-11 LAB — LIPASE, BLOOD: Lipase: 27 U/L (ref 11–51)

## 2023-06-11 MED ORDER — SODIUM CHLORIDE 0.9 % IV BOLUS
1000.0000 mL | Freq: Once | INTRAVENOUS | Status: AC
  Administered 2023-06-11: 1000 mL via INTRAVENOUS

## 2023-06-11 MED ORDER — ONDANSETRON 4 MG PO TBDP
4.0000 mg | ORAL_TABLET | Freq: Once | ORAL | Status: AC
  Administered 2023-06-11: 4 mg via ORAL
  Filled 2023-06-11: qty 1

## 2023-06-11 NOTE — Discharge Instructions (Addendum)
Your exam, labs, and EKG are reassuring at this time.  Your symptoms seem resolved and you are stable for discharge at this time.  You should follow-up with [facility] or what ever local clinic your company has a relationship with.  Consider electrolyte water and balance snacks every 2-3 hours prevent dehydration and/or hyperglycemia.

## 2023-06-11 NOTE — ED Provider Notes (Signed)
[facility] Emergency Department Provider Note     Event Date/Time   First MD Initiated Contact with Patient 06/11/23 1726     (approximate)   History   Emesis   HPI  Marcus Austin is a 32 y.o. male with a history of obesity, anxiety, GERD, presents to the ED for generalized malaise with onset about 1130 this morning.  He noted nausea vomiting at that time.  Reports about 5 episodes of nonbloody, nonbilious emesis, described primarily as large amounts of water.  He continues to endorse some nausea at this time.  Will Patient with this morning level of health and wellbeing and ate breakfast as per usual.  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 He presents to the ED noting some mild neck discomfort as well.  No fevers, chills, or sweats reported. He is currently on physician-assisted weight loss. Currently on phentermine daily.      Physical Exam   Triage Vital Signs: ED Triage Vitals  Enc Vitals Group     BP 06/11/23 1621 (!) 147/86     Pulse Rate 06/11/23 1621 70     Resp 06/11/23 1621 17     Temp 06/11/23 1621 97.6 F (36.4 C)     Temp Source 06/11/23 1621 Oral     SpO2 06/11/23 1618 98 %     Weight 06/11/23 1621 263 lb (119.3 kg)     Height 06/11/23 1621 6\' 2"  (1.88 m)     Head Circumference --      Peak Flow --      Pain Score --      Pain Loc --      Pain Edu? --      Excl. in GC? --     Most recent vital signs: Vitals:   06/11/23 1621 06/11/23 1942  BP: (!) 147/86 (!) 145/87  Pulse: 70 61  Resp: 17 16  Temp: 97.6 F (36.4 C) 97.8 F (36.6 C)  SpO2: 94% 98%    General Awake, no distress. NAD HEENT NCAT. PERRL. EOMI. No rhinorrhea. Mucous membranes are moist.  CV:  Good peripheral perfusion. RRR RESP:  Normal effort. CTA ABD:  No distention.  Soft and nontender NEURO: Cranial nerves II to XII grossly  intact.   ED Results / Procedures / Treatments   Labs (all labs ordered are listed, but only abnormal results are displayed) Labs Reviewed  COMPREHENSIVE METABOLIC PANEL - Abnormal; Notable for the following components:      Result Value   Potassium 3.2 (*)    Glucose, Bld 126 (*)    Calcium 8.8 (*)    All other components within normal limits  URINALYSIS, ROUTINE W REFLEX MICROSCOPIC - Abnormal; Notable for the following components:   Color, Urine YELLOW (*)    APPearance CLEAR (*)    All other components within normal limits  LIPASE, BLOOD  CBC     EKG  Vent. rate 58 BPM PR interval 172 ms QRS duration 96 ms QT/QTcB 392/384 ms P-R-T axes 74 64 50 Sinus bradycardia Otherwise normal ECG No STEMI   RADIOLOGY  No results found.   PROCEDURES:  Critical Care performed: No  Procedures   MEDICATIONS ORDERED IN ED: Medications  ondansetron (ZOFRAN-ODT) disintegrating tablet 4 mg (4 mg Oral Given 06/11/23 1740)  sodium chloride 0.9 % bolus 1,000 mL (0 mLs  Intravenous Stopped 06/11/23 1942)     IMPRESSION / MDM / ASSESSMENT AND PLAN / ED COURSE  I reviewed the triage vital signs and the nursing notes.                              Differential diagnosis includes, but is not limited to, biliary disease (biliary colic, acute cholecystitis, cholangitis, choledocholithiasis, etc), intrathoracic causes for epigastric abdominal pain including ACS, gastritis, duodenitis, pancreatitis, small bowel or large bowel obstruction, abdominal aortic aneurysm, hernia, and ulcer(s).   Patient's presentation is most consistent with acute complicated illness / injury requiring diagnostic workup.  Patient's diagnosis is consistent with likely heat exhaustion with syncopal episode.  With reassuring exam and workup overall.  No evidence of a critical anemia or leukocytosis on his blood labs.  CMP without electrolyte abnormality on exam.  UA was reassuring as it shows no leukocyturia.   EKG without malignant arrhythmia noted although it did pick up water bradycardia rate.  He has been in sinus with a regular rate and rhythm throughout his course in the ED.  Patient reports significant improvement of symptoms after fluid bolus.  No ongoing reports of emesis or discomfort.  Patient reports he stable at this time for discharge.  He has been encouraged to increase intake of electrolyte water during his work activities.  He also should eat every 2-3 hours to help prevent low blood sugar. Patient is to follow up with his PCP as needed or otherwise directed. Patient is given ED precautions to return to the ED for any worsening or new symptoms.     FINAL CLINICAL IMPRESSION(S) / ED DIAGNOSES   Final diagnoses:  Heat exhaustion, initial encounter     Rx / DC Orders   ED Discharge Orders     None        Note:  This document was prepared using Dragon voice recognition software and may include unintentional dictation errors.    Lissa Hoard, PA-C 06/12/23 2303    Georga Hacking, MD 06/16/23 [phone]

## 2023-06-11 NOTE — ED Triage Notes (Signed)
Pt states he started feeling bad around 11:30am then started vomiting. Pt states he threw about about 5 times large amounts of water.PT continues to have nausea and endorses neck pain as well.

## 2023-06-12 ENCOUNTER — Telehealth: Payer: Self-pay

## 2023-06-12 NOTE — Transitions of Care (Post Inpatient/ED Visit) (Signed)
   06/12/2023  Name: Marcus Austin MRN: [id number] DOB: Oct 25, 1991  Today's TOC FU Call Status: Today's TOC FU Call Status:: Successful TOC FU Call Competed TOC FU Call Complete Date: 06/12/23  Transition Care Management Follow-up Telephone Call Date of Discharge: 06/11/23 Discharge Facility: [facility]) Type of Discharge: Emergency Department Reason for ED Visit: Other: (heat exhaustion) How have you been since you were released from the hospital?: Better Any questions or concerns?: No  Items Reviewed: Did you receive and understand the discharge instructions provided?: Yes Medications obtained,verified, and reconciled?: Yes (Medications Reviewed) Any new allergies since your discharge?: No Dietary orders reviewed?: Yes Do you have support at home?: Yes People in Home: spouse  Medications Reviewed Today: Medications Reviewed Today     Reviewed by Karena Addison, LPN (Licensed Practical Nurse) on 06/12/23 at 1505  Med List Status: <None>   Medication Order Taking? Sig Documenting Provider Last Dose Status Informant  busPIRone (BUSPAR) 5 MG tablet [id number]  TAKE 1 TABLET BY MOUTH TWICE A DAY Gabriel Earing, FNP  Active   Cetirizine HCl (ZYRTEC PO) [id number] No Take by mouth as needed. [provider] Taking Active   FLUoxetine (PROZAC) 20 MG tablet [id number]  Take 1 tablet (20 mg total) by mouth daily. Gabriel Earing, FNP  Active   ibuprofen (ADVIL) 200 MG tablet [id number] No Take 400 mg by mouth every 6 (six) hours as needed. [provider] Taking Active             Home Care and Equipment/Supplies: Were Home Health Services Ordered?: NA Any new equipment or medical supplies ordered?: NA  Functional Questionnaire: Do you need assistance with bathing/showering or dressing?: No Do you need assistance with meal preparation?: No Do you need assistance with eating?: No Do you have difficulty maintaining continence:  No Do you need assistance with getting out of bed/getting out of a chair/moving?: No Do you have difficulty managing or taking your medications?: No  Follow up appointments reviewed: PCP Follow-up appointment confirmed?: No (declined) MD Provider Line Number:[phone] Given: No Specialist Hospital Follow-up appointment confirmed?: NA Do you need transportation to your follow-up appointment?: No Do you understand care options if your condition(s) worsen?: Yes-patient verbalized understanding    SIGNATURE Karena Addison, LPN [facility] Nurse Health Advisor Direct Dial [phone]

## 2023-06-13 NOTE — Transitions of Care (Post Inpatient/ED Visit) (Signed)
   06/13/2023  Name: [name] MRN: [id number] DOB: 1991/02/17  Today's TOC FU Call Status:    Transition Care Management Follow-up Telephone Call    Items Reviewed:    Medications Reviewed Today: Medications Reviewed Today     Reviewed by Karena Addison, LPN (Licensed Practical Nurse) on 06/12/23 at 1505  Med List Status: <None>   Medication Order Taking? Sig Documenting Provider Last Dose Status Informant  busPIRone (BUSPAR) 5 MG tablet [id number]  TAKE 1 TABLET BY MOUTH TWICE A DAY Gabriel Earing, FNP  Active   Cetirizine HCl (ZYRTEC PO) [id number] No Take by mouth as needed. [provider] Taking Active   FLUoxetine (PROZAC) 20 MG tablet [id number]  Take 1 tablet (20 mg total) by mouth daily. Gabriel Earing, FNP  Active   ibuprofen (ADVIL) 200 MG tablet [id number] No Take 400 mg by mouth every 6 (six) hours as needed. [provider] Taking Active             Home Care and Equipment/Supplies:    Functional Questionnaire:    Follow up appointments reviewed:      SIGNATURE Karena Addison, LPN [facility] Nurse Health Advisor Direct Dial [phone]

## 2023-07-25 ENCOUNTER — Other Ambulatory Visit: Payer: Self-pay | Admitting: Family Medicine

## 2023-07-25 DIAGNOSIS — F411 Generalized anxiety disorder: Secondary | ICD-10-CM

## 2023-07-25 DIAGNOSIS — F331 Major depressive disorder, recurrent, moderate: Secondary | ICD-10-CM

## 2023-07-25 DIAGNOSIS — F410 Panic disorder [episodic paroxysmal anxiety] without agoraphobia: Secondary | ICD-10-CM

## 2023-09-20 ENCOUNTER — Other Ambulatory Visit: Payer: Self-pay | Admitting: Family Medicine

## 2023-09-20 DIAGNOSIS — F411 Generalized anxiety disorder: Secondary | ICD-10-CM

## 2023-09-20 DIAGNOSIS — F410 Panic disorder [episodic paroxysmal anxiety] without agoraphobia: Secondary | ICD-10-CM

## 2023-09-20 DIAGNOSIS — F331 Major depressive disorder, recurrent, moderate: Secondary | ICD-10-CM
